# Patient Record
Sex: Female | Born: 1950 | Race: White | Hispanic: No | Marital: Married | Smoking: Never smoker
Health system: Southern US, Community
[De-identification: ages and names within clinical notes are randomized; demographics above are authoritative.]

---

## 2009-05-06 ENCOUNTER — Ambulatory Visit: Payer: Self-pay | Admitting: Oncology

## 2009-05-11 ENCOUNTER — Ambulatory Visit: Payer: Self-pay | Admitting: Hematology & Oncology

## 2009-05-19 LAB — CBC WITH DIFFERENTIAL (CANCER CENTER ONLY)
LYMPH#: 2 10*3/uL (ref 0.9–3.3)
LYMPH%: 36.4 % (ref 14.0–48.0)
MCHC: 34.1 g/dL (ref 32.0–36.0)
MCV: 85 fL (ref 81–101)
MONO#: 0.3 10*3/uL (ref 0.1–0.9)
MONO%: 5.2 % (ref 0.0–13.0)
Platelets: 239 10*3/uL (ref 145–400)
RBC: 4.71 10*6/uL (ref 3.70–5.32)
RDW: 12 % (ref 10.5–14.6)

## 2009-05-20 LAB — COMPREHENSIVE METABOLIC PANEL
AST: 23 U/L (ref 0–37)
Alkaline Phosphatase: 91 U/L (ref 39–117)
CO2: 30 mEq/L (ref 19–32)
Chloride: 103 mEq/L (ref 96–112)
Creatinine, Ser: 0.84 mg/dL (ref 0.40–1.20)
Total Bilirubin: 0.5 mg/dL (ref 0.3–1.2)
Total Protein: 6.7 g/dL (ref 6.0–8.3)

## 2009-05-20 LAB — VITAMIN D 25 HYDROXY (VIT D DEFICIENCY, FRACTURES): Vit D, 25-Hydroxy: 36 ng/mL (ref 30–89)

## 2009-05-27 ENCOUNTER — Ambulatory Visit (HOSPITAL_COMMUNITY): Admission: RE | Admit: 2009-05-27 | Discharge: 2009-05-27 | Payer: Self-pay | Admitting: Hematology & Oncology

## 2009-06-16 ENCOUNTER — Ambulatory Visit: Payer: Self-pay | Admitting: Hematology & Oncology

## 2009-06-17 ENCOUNTER — Encounter: Admission: RE | Admit: 2009-06-17 | Discharge: 2009-06-17 | Payer: Self-pay | Admitting: Family Medicine

## 2009-10-07 ENCOUNTER — Ambulatory Visit (HOSPITAL_COMMUNITY): Admission: RE | Admit: 2009-10-07 | Discharge: 2009-10-07 | Payer: Self-pay | Admitting: Hematology & Oncology

## 2009-10-12 ENCOUNTER — Ambulatory Visit: Payer: Self-pay | Admitting: Hematology & Oncology

## 2009-10-14 LAB — CBC WITH DIFFERENTIAL (CANCER CENTER ONLY)
BASO#: 0 10*3/uL (ref 0.0–0.2)
Eosinophils Absolute: 0.1 10*3/uL (ref 0.0–0.5)
HGB: 13.8 g/dL (ref 11.6–15.9)
LYMPH#: 1.8 10*3/uL (ref 0.9–3.3)
MCHC: 33.5 g/dL (ref 32.0–36.0)
MCV: 85 fL (ref 81–101)
MONO#: 0.3 10*3/uL (ref 0.1–0.9)
MONO%: 6.7 % (ref 0.0–13.0)
NEUT#: 1.5 10*3/uL (ref 1.5–6.5)
RDW: 12.1 % (ref 10.5–14.6)

## 2009-10-15 LAB — COMPREHENSIVE METABOLIC PANEL
ALT: 12 U/L (ref 0–35)
AST: 20 U/L (ref 0–37)
Albumin: 4.1 g/dL (ref 3.5–5.2)
Creatinine, Ser: 0.91 mg/dL (ref 0.40–1.20)
Potassium: 4 mEq/L (ref 3.5–5.3)
Total Bilirubin: 0.5 mg/dL (ref 0.3–1.2)

## 2009-10-15 LAB — LACTATE DEHYDROGENASE: LDH: 123 U/L (ref 94–250)

## 2009-10-15 LAB — VITAMIN D 25 HYDROXY (VIT D DEFICIENCY, FRACTURES): Vit D, 25-Hydroxy: 45 ng/mL (ref 30–89)

## 2010-02-09 ENCOUNTER — Ambulatory Visit: Payer: Self-pay | Admitting: Hematology & Oncology

## 2010-02-10 LAB — LACTATE DEHYDROGENASE: LDH: 125 U/L (ref 94–250)

## 2010-02-10 LAB — CBC WITH DIFFERENTIAL (CANCER CENTER ONLY)
BASO#: 0 10*3/uL (ref 0.0–0.2)
BASO%: 0.5 % (ref 0.0–2.0)
EOS%: 3.3 % (ref 0.0–7.0)
Eosinophils Absolute: 0.2 10*3/uL (ref 0.0–0.5)
HCT: 41.5 % (ref 34.8–46.6)
HGB: 14 g/dL (ref 11.6–15.9)
LYMPH#: 2 10*3/uL (ref 0.9–3.3)
LYMPH%: 45.8 % (ref 14.0–48.0)
MCH: 28.8 pg (ref 26.0–34.0)
MCHC: 33.6 g/dL (ref 32.0–36.0)
MCV: 86 fL (ref 81–101)
MONO#: 0.3 10*3/uL (ref 0.1–0.9)
MONO%: 6.8 % (ref 0.0–13.0)
NEUT#: 1.9 10*3/uL (ref 1.5–6.5)
NEUT%: 43.6 % (ref 39.6–80.0)
Platelets: 230 10*3/uL (ref 145–400)
RBC: 4.84 10*6/uL (ref 3.70–5.32)
RDW: 11.9 % (ref 10.5–14.6)
WBC: 4.4 10*3/uL (ref 3.9–10.0)

## 2010-02-10 LAB — COMPREHENSIVE METABOLIC PANEL
ALT: 13 U/L (ref 0–35)
Albumin: 4 g/dL (ref 3.5–5.2)
Glucose, Bld: 93 mg/dL (ref 70–99)
Potassium: 4.3 mEq/L (ref 3.5–5.3)
Sodium: 141 mEq/L (ref 135–145)
Total Protein: 6.6 g/dL (ref 6.0–8.3)

## 2010-02-10 LAB — VITAMIN D 25 HYDROXY (VIT D DEFICIENCY, FRACTURES): Vit D, 25-Hydroxy: 66 ng/mL (ref 30–89)

## 2010-02-17 ENCOUNTER — Ambulatory Visit (HOSPITAL_COMMUNITY): Admission: RE | Admit: 2010-02-17 | Discharge: 2010-02-17 | Payer: Self-pay | Admitting: Hematology & Oncology

## 2010-06-11 ENCOUNTER — Encounter: Payer: Self-pay | Admitting: Hematology & Oncology

## 2010-07-20 ENCOUNTER — Other Ambulatory Visit: Payer: Self-pay | Admitting: Internal Medicine

## 2010-07-20 DIAGNOSIS — Z1231 Encounter for screening mammogram for malignant neoplasm of breast: Secondary | ICD-10-CM

## 2010-07-26 ENCOUNTER — Ambulatory Visit
Admission: RE | Admit: 2010-07-26 | Discharge: 2010-07-26 | Disposition: A | Discharge status: Home / Self Care | Payer: 59 | Source: Ambulatory Visit | Attending: Internal Medicine | Admitting: Internal Medicine

## 2010-07-26 DIAGNOSIS — Z1231 Encounter for screening mammogram for malignant neoplasm of breast: Secondary | ICD-10-CM

## 2010-08-11 ENCOUNTER — Other Ambulatory Visit: Payer: Self-pay | Admitting: Hematology & Oncology

## 2010-08-11 ENCOUNTER — Encounter (HOSPITAL_BASED_OUTPATIENT_CLINIC_OR_DEPARTMENT_OTHER): Payer: 59 | Admitting: Hematology & Oncology

## 2010-08-11 DIAGNOSIS — C8581 Other specified types of non-Hodgkin lymphoma, lymph nodes of head, face, and neck: Secondary | ICD-10-CM

## 2010-08-11 LAB — CBC WITH DIFFERENTIAL (CANCER CENTER ONLY)
BASO#: 0 10*3/uL (ref 0.0–0.2)
BASO%: 0.2 % (ref 0.0–2.0)
EOS%: 3.3 % (ref 0.0–7.0)
HCT: 39.3 % (ref 34.8–46.6)
HGB: 13 g/dL (ref 11.6–15.9)
LYMPH%: 47.3 % (ref 14.0–48.0)
MCV: 83 fL (ref 81–101)
MONO#: 0.4 10*3/uL (ref 0.1–0.9)
NEUT%: 40.8 % (ref 39.6–80.0)
Platelets: 214 10*3/uL (ref 145–400)
RBC: 4.73 10*6/uL (ref 3.70–5.32)
WBC: 4.5 10*3/uL (ref 3.9–10.0)

## 2010-08-12 LAB — COMPREHENSIVE METABOLIC PANEL
Alkaline Phosphatase: 90 U/L (ref 39–117)
BUN: 22 mg/dL (ref 6–23)
CO2: 30 mEq/L (ref 19–32)
Chloride: 106 mEq/L (ref 96–112)
Creatinine, Ser: 0.9 mg/dL (ref 0.40–1.20)
Glucose, Bld: 83 mg/dL (ref 70–99)
Total Bilirubin: 0.4 mg/dL (ref 0.3–1.2)

## 2010-08-12 LAB — LACTATE DEHYDROGENASE: LDH: 139 U/L (ref 94–250)

## 2010-08-12 LAB — VITAMIN D 25 HYDROXY (VIT D DEFICIENCY, FRACTURES): Vit D, 25-Hydroxy: 77 ng/mL (ref 30–89)

## 2011-02-02 ENCOUNTER — Other Ambulatory Visit: Payer: Self-pay | Admitting: Hematology & Oncology

## 2011-02-02 LAB — CBC WITH DIFFERENTIAL (CANCER CENTER ONLY)
BASO#: 0 10*3/uL (ref 0.0–0.2)
Eosinophils Absolute: 0.1 10*3/uL (ref 0.0–0.5)
HGB: 13.5 g/dL (ref 11.6–15.9)
LYMPH#: 2.3 10*3/uL (ref 0.9–3.3)
LYMPH%: 39.7 % (ref 14.0–48.0)
MCV: 83 fL (ref 81–101)
NEUT#: 3.1 10*3/uL (ref 1.5–6.5)
RBC: 4.66 10*6/uL (ref 3.70–5.32)
WBC: 5.9 10*3/uL (ref 3.9–10.0)

## 2011-02-03 LAB — LACTATE DEHYDROGENASE: LDH: 125 U/L (ref 94–250)

## 2011-02-03 LAB — COMPREHENSIVE METABOLIC PANEL
AST: 19 U/L (ref 0–37)
BUN: 19 mg/dL (ref 6–23)
Calcium: 9.5 mg/dL (ref 8.4–10.5)
Chloride: 103 mEq/L (ref 96–112)
Creatinine, Ser: 0.96 mg/dL (ref 0.50–1.10)

## 2011-02-03 LAB — VITAMIN D 25 HYDROXY (VIT D DEFICIENCY, FRACTURES): Vit D, 25-Hydroxy: 71 ng/mL (ref 30–89)

## 2011-08-03 ENCOUNTER — Ambulatory Visit (HOSPITAL_BASED_OUTPATIENT_CLINIC_OR_DEPARTMENT_OTHER): Payer: 59 | Admitting: Hematology & Oncology

## 2011-08-03 ENCOUNTER — Other Ambulatory Visit (HOSPITAL_BASED_OUTPATIENT_CLINIC_OR_DEPARTMENT_OTHER): Payer: 59 | Admitting: Lab

## 2011-08-03 VITALS — BP 121/74 | HR 99 | Temp 97.0°F | Ht 65.5 in | Wt 169.0 lb

## 2011-08-03 DIAGNOSIS — C8595 Non-Hodgkin lymphoma, unspecified, lymph nodes of inguinal region and lower limb: Secondary | ICD-10-CM

## 2011-08-03 DIAGNOSIS — M810 Age-related osteoporosis without current pathological fracture: Secondary | ICD-10-CM

## 2011-08-03 DIAGNOSIS — C8590 Non-Hodgkin lymphoma, unspecified, unspecified site: Secondary | ICD-10-CM

## 2011-08-03 LAB — COMPREHENSIVE METABOLIC PANEL WITH GFR
ALT: 15 U/L (ref 0–35)
AST: 24 U/L (ref 0–37)
Albumin: 4 g/dL (ref 3.5–5.2)
Alkaline Phosphatase: 91 U/L (ref 39–117)
BUN: 20 mg/dL (ref 6–23)
CO2: 29 meq/L (ref 19–32)
Calcium: 9.5 mg/dL (ref 8.4–10.5)
Chloride: 104 meq/L (ref 96–112)
Creatinine, Ser: 0.98 mg/dL (ref 0.50–1.10)
Glucose, Bld: 83 mg/dL (ref 70–99)
Potassium: 4.4 meq/L (ref 3.5–5.3)
Sodium: 142 meq/L (ref 135–145)
Total Bilirubin: 0.5 mg/dL (ref 0.3–1.2)
Total Protein: 6.2 g/dL (ref 6.0–8.3)

## 2011-08-03 LAB — CBC WITH DIFFERENTIAL (CANCER CENTER ONLY)
Eosinophils Absolute: 0.2 10*3/uL (ref 0.0–0.5)
LYMPH#: 2.4 10*3/uL (ref 0.9–3.3)
MCV: 85 fL (ref 81–101)
MONO#: 0.5 10*3/uL (ref 0.1–0.9)
NEUT#: 1.9 10*3/uL (ref 1.5–6.5)
Platelets: 214 10*3/uL (ref 145–400)
RBC: 4.83 10*6/uL (ref 3.70–5.32)
WBC: 5 10*3/uL (ref 3.9–10.0)

## 2011-08-03 LAB — LACTATE DEHYDROGENASE: LDH: 132 U/L (ref 94–250)

## 2011-08-03 NOTE — Progress Notes (Signed)
This office note has been dictated.

## 2011-08-06 NOTE — Progress Notes (Signed)
CC:   Crist Fat. Rivard, M.D. Tammy R. Collins Scotland, M.D. 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  CURRENT THERAPY:  Observation.  INTERIM HISTORY:  Elaine Henderson comes in for a 6 month followup.  She is doing great.  She and her husband are getting ready for the daughter's marriage on Easter.  They are heading over to [place] for this.  She does see Dr. Dois Davenport Rivard now.  She underwent ultrasound of the pelvic area.  Everything looked fantastic.  The patient has not had any noted any problems with cough.  There has been no nausea, vomiting. There has been no weight loss or weight gain.  There has been no rashes. She has had no pruritus.  PHYSICAL EXAMINATION:  General:  This is a well-developed, well- nourished white female in no obvious distress.  Vital signs:  97, pulse 90, respiratory rate 18, blood pressure 121/74.  Weight is 169.  Head and neck:  Exam shows a normocephalic, atraumatic skull.  There are no ocular or oral lesions.  There are no palpable cervical or supraclavicular lymph nodes.  Lungs:  Are clear bilaterally.  Cardiac: Exam regular rate and rhythm with normal S1, S2.  There are no murmurs, rubs or bruits.  Abdomen:  Soft with good bowel sounds.  There is no palpable abdominal mass.  There is no palpable hepatosplenomegaly. Axillary exam shows no axillary adenopathy bilaterally.  Extremities: Shows no clubbing, cyanosis or edema.  Neurologic:  Exam shows no focal neurological deficits.  Skin:  No rash, ecchymosis or petechia.  LABORATORY STUDIES:  Show a white cell count of 5, hemoglobin 14, hematocrit 41, platelet count 214.  IMPRESSION:  Elaine Henderson is a 61 year old white female with history of diffuse large cell non-Hodgkin's lymphoma of the cervix.  She underwent chemotherapy with R-CHOP.  She completed chemotherapy back in  I think early 2010.  For now, I do not see  any evidence of recurrence.  I do not see need for any scans right now.  We will get her back in 6 months.    ______________________________ Josph Macho, M.D. PRE/MEDQ  D:  08/03/2011  T:  08/03/2011  Job:  [id number]

## 2011-08-07 ENCOUNTER — Other Ambulatory Visit: Payer: Self-pay | Admitting: Internal Medicine

## 2011-08-07 DIAGNOSIS — Z803 Family history of malignant neoplasm of breast: Secondary | ICD-10-CM

## 2011-08-07 DIAGNOSIS — Z1231 Encounter for screening mammogram for malignant neoplasm of breast: Secondary | ICD-10-CM

## 2011-08-10 ENCOUNTER — Telehealth: Payer: Self-pay | Admitting: *Deleted

## 2011-08-10 NOTE — Telephone Encounter (Signed)
Dr. Gustavo Lah message left on pt's home answering machine.

## 2011-08-10 NOTE — Telephone Encounter (Signed)
Message copied by Mirian Capuchin on Fri Aug 10, 2011  6:03 PM ------      Message from: Josph Macho      Created: Tue Aug 07, 2011  9:30 PM       Call - labs are great.  Elaine Henderson

## 2011-08-13 ENCOUNTER — Ambulatory Visit
Admission: RE | Admit: 2011-08-13 | Discharge: 2011-08-13 | Disposition: A | Discharge status: Home / Self Care | Payer: 59 | Source: Ambulatory Visit | Attending: Internal Medicine | Admitting: Internal Medicine

## 2011-08-13 DIAGNOSIS — Z803 Family history of malignant neoplasm of breast: Secondary | ICD-10-CM

## 2011-08-13 DIAGNOSIS — Z1231 Encounter for screening mammogram for malignant neoplasm of breast: Secondary | ICD-10-CM

## 2012-02-29 ENCOUNTER — Other Ambulatory Visit (HOSPITAL_BASED_OUTPATIENT_CLINIC_OR_DEPARTMENT_OTHER): Payer: 59 | Admitting: Lab

## 2012-02-29 ENCOUNTER — Ambulatory Visit (HOSPITAL_BASED_OUTPATIENT_CLINIC_OR_DEPARTMENT_OTHER): Payer: 59 | Admitting: Hematology & Oncology

## 2012-02-29 VITALS — BP 108/59 | HR 70 | Temp 97.7°F | Resp 16 | Ht 66.0 in | Wt 173.0 lb

## 2012-02-29 DIAGNOSIS — C8590 Non-Hodgkin lymphoma, unspecified, unspecified site: Secondary | ICD-10-CM

## 2012-02-29 DIAGNOSIS — C8595 Non-Hodgkin lymphoma, unspecified, lymph nodes of inguinal region and lower limb: Secondary | ICD-10-CM

## 2012-02-29 DIAGNOSIS — M810 Age-related osteoporosis without current pathological fracture: Secondary | ICD-10-CM

## 2012-02-29 LAB — CBC WITH DIFFERENTIAL (CANCER CENTER ONLY)
BASO#: 0 10*3/uL (ref 0.0–0.2)
Eosinophils Absolute: 0.1 10*3/uL (ref 0.0–0.5)
HCT: 42.1 % (ref 34.8–46.6)
HGB: 14.6 g/dL (ref 11.6–15.9)
LYMPH#: 2.1 10*3/uL (ref 0.9–3.3)
LYMPH%: 43.7 % (ref 14.0–48.0)
MCV: 83 fL (ref 81–101)
MONO#: 0.4 10*3/uL (ref 0.1–0.9)
NEUT%: 45.6 % (ref 39.6–80.0)
WBC: 4.9 10*3/uL (ref 3.9–10.0)

## 2012-02-29 NOTE — Progress Notes (Signed)
This office note has been dictated.

## 2012-03-01 LAB — COMPREHENSIVE METABOLIC PANEL
AST: 19 U/L (ref 0–37)
Albumin: 3.8 g/dL (ref 3.5–5.2)
BUN: 19 mg/dL (ref 6–23)
CO2: 31 mEq/L (ref 19–32)
Calcium: 9.6 mg/dL (ref 8.4–10.5)
Chloride: 103 mEq/L (ref 96–112)
Glucose, Bld: 109 mg/dL — ABNORMAL HIGH (ref 70–99)
Potassium: 4.3 mEq/L (ref 3.5–5.3)

## 2012-03-01 LAB — VITAMIN D 25 HYDROXY (VIT D DEFICIENCY, FRACTURES): Vit D, 25-Hydroxy: 69 ng/mL (ref 30–89)

## 2012-03-03 NOTE — Progress Notes (Signed)
CC:   Crist Fat. Rivard, M.D. Patrcia Dolly, MD, Fax [phone] Tammy R. Collins Scotland, M.D.  DIAGNOSIS:  Stage 1E large-cell lymphoma of the cervix, remission.  CURRENT THERAPY:  Observation.  INTERIM HISTORY:  Ms. Jeske comes in for followup.  We see her every 6 months.  Since we last saw her, she has been doing real well.  She did have a MRSA infection down in the labial area.  This was taken care of, I think, surgically and then antibiotics.  She feels well now.  She has had no problems with respect to recurrent lymphoma.  It has been 4 years since she completed her chemotherapy.  She has been up to [place], I think, for a couple of weeks, for a nephew's wedding.  She now works at a [employer].  She got a job in June.  The patient has had no problems with fevers.  There has been no bony pain.  There has been no cough or shortness of breath.  She has had no change with medications.  PHYSICAL EXAMINATION:  This is a well-developed, well-nourished white female in no obvious distress.  Vital signs:  Temperature of 97.7, pulse 70, respiratory rate 16, blood pressure is 108/59.  Weight is 173. Head/neck:  Normocephalic, atraumatic skull.  There are no ocular or oral lesions.  There are no palpable cervical or supraclavicular lymph nodes.  Lungs:  Clear to percussion and auscultation bilaterally. Cardiac:  Regular rate and rhythm with a normal S1 and S2.  There are no murmurs, rubs or bruits.  Abdomen:  Soft with good bowel sounds.  There is no palpable abdominal mass.  There is no palpable hepatosplenomegaly. Extremities:  No clubbing, cyanosis or edema.  Neurologic:  No focal neurological deficits.  LABORATORY STUDIES:  White cell count is 4.9, hemoglobin 14.6, hematocrit 42.1, platelet count 221.  IMPRESSION:  Ms. Andros is a 61 year old white female with large-cell lymphoma of the cervix.  She was treated with systemic chemotherapy.  She is doing quite well.  I do not  see any evidence of recurrent disease.  I do not see a need for any scans on her right now.  She is asymptomatic.  She is taking vitamin D, which I think is key for her.  We will plan to get her back in 6 months' time.  She will be out 5 years in, I think, November of 2014.  At that point in time, we can probably let her go from the clinic.    ______________________________ Josph Macho, M.D. PRE/MEDQ  D:  02/29/2012  T:  02/29/2012  Job:  [id number]

## 2012-03-10 ENCOUNTER — Telehealth: Payer: Self-pay | Admitting: Hematology & Oncology

## 2012-03-10 NOTE — Telephone Encounter (Signed)
Message copied by Cathi Roan on Mon Mar 10, 2012 10:17 AM ------      Message from: Currie, Virginia N      Created: Fri Mar 07, 2012 12:35 PM                   ----- Message -----         From: Josph Macho, MD         Sent: 03/06/2012   7:43 AM           To: Nanci Pina Nurse Hp            Call and tell her that her labs look fantastic. Thanks. Cindee Lame

## 2012-03-10 NOTE — Telephone Encounter (Addendum)
Message copied by Cathi Roan on Mon Mar 10, 2012  3:36 PM ------      Message from: Gray, Virginia N      Created: Fri Mar 07, 2012 12:35 PM                   ----- Message -----         From: Josph Macho, MD         Sent: 03/06/2012   7:43 AM           To: Nanci Pina Nurse Hp          Call and tell her that her labs look fantastic. Thanks. Cindee Lame  03-10-12  3:37pm   Called patient on home phone and left message regarding above MD note, advised pt if any questions to call office and ask for nurse. Lupita Raider LPN

## 2012-08-29 ENCOUNTER — Ambulatory Visit (HOSPITAL_BASED_OUTPATIENT_CLINIC_OR_DEPARTMENT_OTHER): Payer: 59 | Admitting: Hematology & Oncology

## 2012-08-29 ENCOUNTER — Other Ambulatory Visit (HOSPITAL_BASED_OUTPATIENT_CLINIC_OR_DEPARTMENT_OTHER): Payer: 59 | Admitting: Lab

## 2012-08-29 VITALS — BP 123/62 | HR 61 | Temp 97.4°F | Resp 16 | Ht 66.0 in | Wt 173.0 lb

## 2012-08-29 DIAGNOSIS — C8590 Non-Hodgkin lymphoma, unspecified, unspecified site: Secondary | ICD-10-CM

## 2012-08-29 DIAGNOSIS — C8595 Non-Hodgkin lymphoma, unspecified, lymph nodes of inguinal region and lower limb: Secondary | ICD-10-CM

## 2012-08-29 LAB — CBC WITH DIFFERENTIAL (CANCER CENTER ONLY)
Eosinophils Absolute: 0.1 10*3/uL (ref 0.0–0.5)
LYMPH#: 2.4 10*3/uL (ref 0.9–3.3)
MCH: 28.7 pg (ref 26.0–34.0)
MONO%: 8 % (ref 0.0–13.0)
NEUT#: 2.4 10*3/uL (ref 1.5–6.5)
Platelets: 213 10*3/uL (ref 145–400)
RBC: 4.88 10*6/uL (ref 3.70–5.32)
WBC: 5.4 10*3/uL (ref 3.9–10.0)

## 2012-08-29 LAB — COMPREHENSIVE METABOLIC PANEL
AST: 20 U/L (ref 0–37)
Albumin: 3.9 g/dL (ref 3.5–5.2)
Alkaline Phosphatase: 84 U/L (ref 39–117)
Glucose, Bld: 91 mg/dL (ref 70–99)
Potassium: 4.4 mEq/L (ref 3.5–5.3)
Sodium: 139 mEq/L (ref 135–145)
Total Protein: 6.4 g/dL (ref 6.0–8.3)

## 2012-08-29 NOTE — Progress Notes (Signed)
This office note has been dictated.

## 2012-09-01 NOTE — Progress Notes (Signed)
CC:   Crist Fat. Rivard, M.D. Tammy R. Collins Scotland, M.D. Burnett Kanaris, MD, Fax [phone]  DIAGNOSIS:  Stage IE large-cell lymphoma of the cervix, clinical remission.  CURRENT THERAPY:  Observation.  INTERIM HISTORY:  Ms. Elaine Henderson comes in for followup.  She is doing quite well.  We see her every 6 months.  Since we last saw her, she has had no problems.  She has had no fever, sweats or chills.  She has had no bony pain. There has been no bleeding.  She has had no cough or shortness of breath.  The patient says she does have occasional bouts of diarrhea.  PHYSICAL EXAMINATION:  General:  This is a well-developed, well- nourished white female in no obvious distress.  Vital signs: Temperature of 97.4, pulse 61, respiratory rate 16, blood pressure 123/62.  Weight is 173.  Head and neck:  Normocephalic, atraumatic skull.  There are no ocular or oral lesions.  There are no palpable cervical or supraclavicular lymph nodes.  Lungs:  Clear bilaterally. Cardiac:  Regular rate and rhythm, with normal S1, S2.  There are no murmurs, rubs or bruits.  Abdomen:  Soft with good bowel sounds.  There is no palpable abdominal mass.  There is no fluid wave.  There is no palpable hepatosplenomegaly.  Extremities:  Show no clubbing, cyanosis or edema.  Skin:  No rashes, ecchymosis, or petechia.  LABORATORY STUDIES:  White cell count is 5.4, hemoglobin 14, hematocrit 41.2, platelet count 213,000.  IMPRESSION:  Ms. Elaine Henderson is a very nice 62 year old white female with a history of a large-cell lymphoma of the cervix.  She is doing very well.  She is about 5 years out.  We will go ahead and get her back in 6 more months.  I do not see a need for any scans or additional lab work.    ______________________________ Josph Macho, M.D. PRE/MEDQ  D:  08/29/2012  T:  08/30/2012  Job:  [id number]

## 2012-10-20 ENCOUNTER — Other Ambulatory Visit: Payer: Self-pay

## 2012-10-20 DIAGNOSIS — Z1231 Encounter for screening mammogram for malignant neoplasm of breast: Secondary | ICD-10-CM

## 2012-12-01 ENCOUNTER — Ambulatory Visit: Payer: 59

## 2012-12-01 ENCOUNTER — Ambulatory Visit
Admission: RE | Admit: 2012-12-01 | Discharge: 2012-12-01 | Disposition: A | Discharge status: Home / Self Care | Payer: 59 | Source: Ambulatory Visit

## 2012-12-01 DIAGNOSIS — Z1231 Encounter for screening mammogram for malignant neoplasm of breast: Secondary | ICD-10-CM

## 2012-12-03 ENCOUNTER — Other Ambulatory Visit: Payer: Self-pay | Admitting: Internal Medicine

## 2012-12-03 DIAGNOSIS — R928 Other abnormal and inconclusive findings on diagnostic imaging of breast: Secondary | ICD-10-CM

## 2012-12-08 ENCOUNTER — Other Ambulatory Visit: Payer: Self-pay | Admitting: Registered Nurse

## 2012-12-08 ENCOUNTER — Other Ambulatory Visit (HOSPITAL_COMMUNITY)
Admission: RE | Admit: 2012-12-08 | Discharge: 2012-12-08 | Disposition: A | Discharge status: Home / Self Care | Payer: 59 | Source: Ambulatory Visit | Attending: Internal Medicine | Admitting: Internal Medicine

## 2012-12-08 DIAGNOSIS — Z01419 Encounter for gynecological examination (general) (routine) without abnormal findings: Secondary | ICD-10-CM | POA: Insufficient documentation

## 2012-12-19 ENCOUNTER — Ambulatory Visit
Admission: RE | Admit: 2012-12-19 | Discharge: 2012-12-19 | Disposition: A | Discharge status: Home / Self Care | Payer: 59 | Source: Ambulatory Visit | Attending: Internal Medicine | Admitting: Internal Medicine

## 2012-12-19 DIAGNOSIS — R928 Other abnormal and inconclusive findings on diagnostic imaging of breast: Secondary | ICD-10-CM

## 2013-02-05 ENCOUNTER — Telehealth: Payer: Self-pay | Admitting: Hematology & Oncology

## 2013-02-05 NOTE — Telephone Encounter (Signed)
Pt called to cancel and reschedule

## 2013-02-13 ENCOUNTER — Other Ambulatory Visit: Payer: 59 | Admitting: Lab

## 2013-02-13 ENCOUNTER — Ambulatory Visit: Payer: 59 | Admitting: Hematology & Oncology

## 2013-02-13 ENCOUNTER — Other Ambulatory Visit: Payer: Self-pay | Admitting: Gastroenterology

## 2013-02-16 ENCOUNTER — Other Ambulatory Visit (HOSPITAL_BASED_OUTPATIENT_CLINIC_OR_DEPARTMENT_OTHER): Payer: 59 | Admitting: Lab

## 2013-02-16 ENCOUNTER — Ambulatory Visit (HOSPITAL_BASED_OUTPATIENT_CLINIC_OR_DEPARTMENT_OTHER): Payer: 59 | Admitting: Hematology & Oncology

## 2013-02-16 VITALS — BP 113/63 | HR 67 | Temp 98.1°F | Resp 14 | Ht 66.0 in | Wt 171.0 lb

## 2013-02-16 DIAGNOSIS — C8589 Other specified types of non-Hodgkin lymphoma, extranodal and solid organ sites: Secondary | ICD-10-CM

## 2013-02-16 DIAGNOSIS — C8590 Non-Hodgkin lymphoma, unspecified, unspecified site: Secondary | ICD-10-CM

## 2013-02-16 LAB — CBC WITH DIFFERENTIAL (CANCER CENTER ONLY)
BASO%: 0.4 % (ref 0.0–2.0)
EOS%: 3.2 % (ref 0.0–7.0)
LYMPH%: 49.5 % — ABNORMAL HIGH (ref 14.0–48.0)
MCH: 28.4 pg (ref 26.0–34.0)
MCV: 85 fL (ref 81–101)
MONO%: 9.4 % (ref 0.0–13.0)
Platelets: 236 10*3/uL (ref 145–400)
RDW: 13.2 % (ref 11.1–15.7)

## 2013-02-16 LAB — COMPREHENSIVE METABOLIC PANEL
ALT: 13 U/L (ref 0–35)
Alkaline Phosphatase: 92 U/L (ref 39–117)
Sodium: 141 mEq/L (ref 135–145)
Total Bilirubin: 0.6 mg/dL (ref 0.3–1.2)
Total Protein: 6.5 g/dL (ref 6.0–8.3)

## 2013-02-16 NOTE — Progress Notes (Signed)
This office note has been dictated.

## 2013-02-17 NOTE — Progress Notes (Signed)
CC:   Elaine Henderson. Elaine Henderson, M.D.  DIAGNOSIS:  Stage IE large-cell lymphoma of the cervix -- clinical remission.  CURRENT THERAPY:  Observation.  INTERIM HISTORY:  Elaine Henderson comes in for followup.  We last saw her 6 months ago.  She has been doing well.  They just had their first grandchild.  It was a boy.  He was born a few weeks ago.  They are quite happy about this.  Overall, she has been doing well.  There has been no problems with nausea or vomiting.  There has been no fever.  There has been no change in bowel or bladder habits.  She has had a colonoscopy recently.  Everything turned out okay with the colonoscopy.  There has been no leg swelling.  She has had no rashes.  There has been no change in her medications.  Overall, her performance status is ECOG 0.  PHYSICAL EXAMINATION:  General:  This is a well-developed, well- nourished white female in no obvious distress.  Vital signs: Temperature of 98.1, pulse 67, respiratory rate 14, blood pressure 113/63.  Weight is 171 pounds.  Head and neck:  Exam shows a normocephalic, atraumatic skull.  She has no ocular or oral lesions. There are no cervical or supraclavicular lymph nodes.  Lungs:  Clear bilaterally.  Cardiac:  Regular rate and rhythm with a normal S1 and S2. There are no murmurs, rubs or bruits.  Abdomen:  Soft.  She has good bowel sounds.  There is no fluid wave.  There is no palpable hepatosplenomegaly.  Back:  No tenderness over the spine, ribs or hips. Extremities:  Shows no clubbing, cyanosis or edema.  Skin:  No rashes, ecchymoses or petechia.  Neurological:  Shows no focal neurological deficits.  LABORATORIES:  White cell count is 4.7, hemoglobin 13.5, hematocrit 40.3, platelet count 236.  Her LDH is 142.  Liver panel and electrolytes are all normal.  IMPRESSION:  Elaine Henderson is a very nice 62 year old white female with stage IE large cell non-Hodgkin lymphoma.  She is doing great.  She is now been in  remission for 5 years.  I still feel we have to follow her every 6 months for right now.  I noted that she does have a little more in the way of lymphocytes versus __________ neutrophils.  I did look at her blood smear.  I do not see anything unusual with the lymphocytes.  We will have to watch this.  We will see her back in 6 months.  I do not see that we have to do any flow cytometry studies on the blood at this point in time.  However, if we do find that her lymphocytes do continue to increase in percentage, we may have to consider this.  I spent a good half hour with Elaine Henderson  and her husband.  As always, it was fun to talk to them.    ______________________________ Elaine Henderson, M.D. PRE/MEDQ  D:  02/16/2013  T:  02/17/2013  Job:  [id number]

## 2013-08-21 ENCOUNTER — Encounter: Payer: Self-pay | Admitting: Hematology & Oncology

## 2013-08-21 ENCOUNTER — Ambulatory Visit (HOSPITAL_BASED_OUTPATIENT_CLINIC_OR_DEPARTMENT_OTHER): Payer: 59 | Admitting: Hematology & Oncology

## 2013-08-21 ENCOUNTER — Other Ambulatory Visit (HOSPITAL_BASED_OUTPATIENT_CLINIC_OR_DEPARTMENT_OTHER): Payer: 59 | Admitting: Lab

## 2013-08-21 VITALS — BP 125/58 | HR 63 | Temp 98.2°F | Resp 14 | Ht 66.0 in | Wt 180.0 lb

## 2013-08-21 DIAGNOSIS — C8589 Other specified types of non-Hodgkin lymphoma, extranodal and solid organ sites: Secondary | ICD-10-CM

## 2013-08-21 DIAGNOSIS — E559 Vitamin D deficiency, unspecified: Secondary | ICD-10-CM

## 2013-08-21 DIAGNOSIS — C8590 Non-Hodgkin lymphoma, unspecified, unspecified site: Secondary | ICD-10-CM

## 2013-08-21 DIAGNOSIS — R42 Dizziness and giddiness: Secondary | ICD-10-CM

## 2013-08-21 LAB — CBC WITH DIFFERENTIAL (CANCER CENTER ONLY)
BASO#: 0 10*3/uL (ref 0.0–0.2)
BASO%: 0.6 % (ref 0.0–2.0)
EOS ABS: 0.2 10*3/uL (ref 0.0–0.5)
EOS%: 3 % (ref 0.0–7.0)
HCT: 42 % (ref 34.8–46.6)
HEMOGLOBIN: 14.2 g/dL (ref 11.6–15.9)
LYMPH#: 2.5 10*3/uL (ref 0.9–3.3)
LYMPH%: 49.6 % — ABNORMAL HIGH (ref 14.0–48.0)
MCH: 28.2 pg (ref 26.0–34.0)
MCHC: 33.8 g/dL (ref 32.0–36.0)
MCV: 84 fL (ref 81–101)
MONO#: 0.5 10*3/uL (ref 0.1–0.9)
MONO%: 9.1 % (ref 0.0–13.0)
NEUT%: 37.7 % — ABNORMAL LOW (ref 39.6–80.0)
NEUTROS ABS: 1.9 10*3/uL (ref 1.5–6.5)
PLATELETS: 239 10*3/uL (ref 145–400)
RBC: 5.03 10*6/uL (ref 3.70–5.32)
RDW: 13.4 % (ref 11.1–15.7)
WBC: 5.1 10*3/uL (ref 3.9–10.0)

## 2013-08-21 LAB — CHCC SATELLITE - SMEAR

## 2013-08-21 NOTE — Progress Notes (Signed)
Hematology and Oncology Follow Up Visit  Elaine Henderson [id number] 01-29-1951 63 y.o. 08/21/2013   Principle Diagnosis:   Stage IE large cell lymphoma of the cervix-clinical remission  Current Therapy:    Observation     Interim History:  Ms.  Elaine Henderson is back for followup. We last saw her back in September. She's been doing fairly well. She is a grandmother. Her son had a son. She and her husband are really excited about this. Everything is doing well. She has had some dizzy spells. Mr. as to why she would have these. They do not seem to be all that frequent. She takes Antivert when they happen. There is no obvious vertigo. She has no tinnitus.  She's had a problem with bowels or bladder. She's had no fever sweats or chills.  Her last mammogram was done back in August of 2014. Medications: Current outpatient prescriptions:Calcium Carbonate-Vitamin D (CALTRATE 600+D) 600-400 MG-UNIT per tablet, Take 1 tablet by mouth daily., Disp: , Rfl: ;  Cholecalciferol (VITAMIN D-3) 5000 UNITS TABS, Take by mouth every morning., Disp: , Rfl: ;  Multiple Vitamin (MULTIVITAMIN) capsule, Take 1 capsule by mouth daily., Disp: , Rfl: ;  Probiotic Product (PROBIOTIC DAILY PO), Take by mouth every morning., Disp: , Rfl:  Ranitidine HCl (ZANTAC 75 PO), Take by mouth 2 (two) times daily., Disp: , Rfl:   Allergies:  Allergies  Allergen Reactions  . Nitrofurantoin Monohyd Macro Nausea Only    Past Medical History, Surgical history, Social history, and Family History were reviewed and updated.  Review of Systems: As above  Physical Exam:  height is 5\' 6"  (1.676 m) and weight is 180 lb (81.647 kg). Her oral temperature is 98.2 F (36.8 C). Her blood pressure is 125/58 and her pulse is 63. Her respiration is 14.   Lungs are clear. I hear no carotid bruits. Cardiac exam regular rhythm. Abdomen soft. No fluid wave. Good bowel sounds. No palpable hepatosplenomegaly. Back exam no tenderness over the spine  ribs or hips. Extremities good strength bilaterally. Skin exam no rashes. Neurological exam no focal neurological deficits.  Lab Results  Component Value Date   WBC 5.1 08/21/2013   HGB 14.2 08/21/2013   HCT 42.0 08/21/2013   MCV 84 08/21/2013   PLT 239 08/21/2013     Chemistry      Component Value Date/Time   NA 141 02/16/2013 0947   K 4.3 02/16/2013 0947   CL 104 02/16/2013 0947   CO2 31 02/16/2013 0947   BUN 18 02/16/2013 0947   CREATININE 0.91 02/16/2013 0947      Component Value Date/Time   CALCIUM 9.5 02/16/2013 0947   ALKPHOS 92 02/16/2013 0947   AST 23 02/16/2013 0947   ALT 13 02/16/2013 0947   BILITOT 0.6 02/16/2013 0947         Impression and Plan: Ms. [name] is a 63 year old white female. She has a history of large cell lymphoma of the cervix. She now is free from disease for over 5 years.  Again, I am not sure. as to why she has had the dizziness.  We will go ahead and plan for another followup in 6 months.  I think if all looks good when we see her back, and that hopefully we can let her go from the practice.   Volanda Napoleon, MD 4/3/201510:03 AM

## 2013-08-24 ENCOUNTER — Telehealth: Payer: Self-pay | Admitting: *Deleted

## 2013-08-24 NOTE — Telephone Encounter (Addendum)
Message copied by Orlando Penner on Mon Aug 24, 2013 11:19 AM ------      Message from: Volanda Napoleon      Created: Fri Aug 21, 2013  3:58 PM       Call - labs look great!! Laurey Arrow ------This message left on pt's home answering machine

## 2013-08-25 LAB — COMPREHENSIVE METABOLIC PANEL
ALBUMIN: 3.8 g/dL (ref 3.5–5.2)
ALK PHOS: 91 U/L (ref 39–117)
ALT: 15 U/L (ref 0–35)
AST: 20 U/L (ref 0–37)
BILIRUBIN TOTAL: 0.5 mg/dL (ref 0.2–1.2)
BUN: 25 mg/dL — ABNORMAL HIGH (ref 6–23)
CO2: 25 meq/L (ref 19–32)
Calcium: 9.7 mg/dL (ref 8.4–10.5)
Chloride: 106 mEq/L (ref 96–112)
Creatinine, Ser: 1.02 mg/dL (ref 0.50–1.10)
Glucose, Bld: 93 mg/dL (ref 70–99)
POTASSIUM: 4.4 meq/L (ref 3.5–5.3)
SODIUM: 142 meq/L (ref 135–145)
TOTAL PROTEIN: 6.5 g/dL (ref 6.0–8.3)

## 2013-08-25 LAB — PROTEIN ELECTROPHORESIS, SERUM
ALPHA-2-GLOBULIN: 11.7 % (ref 7.1–11.8)
Albumin ELP: 58.3 % (ref 55.8–66.1)
Alpha-1-Globulin: 2.9 % (ref 2.9–4.9)
BETA 2: 4.9 % (ref 3.2–6.5)
BETA GLOBULIN: 6.2 % (ref 4.7–7.2)
GAMMA GLOBULIN: 16 % (ref 11.1–18.8)
Total Protein, Serum Electrophoresis: 6.5 g/dL (ref 6.0–8.3)

## 2013-08-25 LAB — LACTATE DEHYDROGENASE: LDH: 152 U/L (ref 94–250)

## 2014-02-08 ENCOUNTER — Other Ambulatory Visit: Payer: Self-pay

## 2014-02-08 DIAGNOSIS — Z1231 Encounter for screening mammogram for malignant neoplasm of breast: Secondary | ICD-10-CM

## 2014-02-22 ENCOUNTER — Ambulatory Visit
Admission: RE | Admit: 2014-02-22 | Discharge: 2014-02-22 | Disposition: A | Discharge status: Home / Self Care | Payer: 59 | Source: Ambulatory Visit

## 2014-02-22 DIAGNOSIS — Z1231 Encounter for screening mammogram for malignant neoplasm of breast: Secondary | ICD-10-CM

## 2014-03-05 ENCOUNTER — Ambulatory Visit: Payer: 59 | Admitting: Hematology & Oncology

## 2014-03-05 ENCOUNTER — Other Ambulatory Visit: Payer: 59 | Admitting: Lab

## 2014-04-23 ENCOUNTER — Telehealth: Payer: Self-pay | Admitting: Hematology & Oncology

## 2014-04-23 ENCOUNTER — Other Ambulatory Visit: Payer: 59 | Admitting: Lab

## 2014-04-23 ENCOUNTER — Ambulatory Visit: Payer: 59 | Admitting: Family

## 2014-04-23 NOTE — Telephone Encounter (Signed)
Patient was sch for apt today.  Patient cx apt and left due to wait time, even though she was informed the computers were down.  She stated she would call back to resch

## 2015-02-14 ENCOUNTER — Other Ambulatory Visit: Payer: Self-pay

## 2015-02-14 DIAGNOSIS — Z1231 Encounter for screening mammogram for malignant neoplasm of breast: Secondary | ICD-10-CM

## 2015-02-28 ENCOUNTER — Ambulatory Visit
Admission: RE | Admit: 2015-02-28 | Discharge: 2015-02-28 | Disposition: A | Discharge status: Home / Self Care | Payer: BLUE CROSS/BLUE SHIELD | Source: Ambulatory Visit

## 2015-02-28 DIAGNOSIS — Z1231 Encounter for screening mammogram for malignant neoplasm of breast: Secondary | ICD-10-CM

## 2015-03-01 ENCOUNTER — Other Ambulatory Visit: Payer: Self-pay | Admitting: Internal Medicine

## 2015-03-01 DIAGNOSIS — R928 Other abnormal and inconclusive findings on diagnostic imaging of breast: Secondary | ICD-10-CM

## 2015-03-09 ENCOUNTER — Ambulatory Visit
Admission: RE | Admit: 2015-03-09 | Discharge: 2015-03-09 | Disposition: A | Discharge status: Home / Self Care | Payer: BLUE CROSS/BLUE SHIELD | Source: Ambulatory Visit | Attending: Internal Medicine | Admitting: Internal Medicine

## 2015-03-09 ENCOUNTER — Other Ambulatory Visit: Payer: BLUE CROSS/BLUE SHIELD

## 2015-03-09 DIAGNOSIS — R928 Other abnormal and inconclusive findings on diagnostic imaging of breast: Secondary | ICD-10-CM

## 2015-03-11 ENCOUNTER — Other Ambulatory Visit: Payer: BLUE CROSS/BLUE SHIELD

## 2016-06-22 ENCOUNTER — Other Ambulatory Visit: Payer: Self-pay | Admitting: Internal Medicine

## 2016-06-22 DIAGNOSIS — Z1231 Encounter for screening mammogram for malignant neoplasm of breast: Secondary | ICD-10-CM

## 2016-07-02 ENCOUNTER — Ambulatory Visit
Admission: RE | Admit: 2016-07-02 | Discharge: 2016-07-02 | Disposition: A | Discharge status: Home / Self Care | Payer: Medicare Other | Source: Ambulatory Visit | Attending: Internal Medicine | Admitting: Internal Medicine

## 2016-07-02 DIAGNOSIS — Z1231 Encounter for screening mammogram for malignant neoplasm of breast: Secondary | ICD-10-CM | POA: Diagnosis not present

## 2016-08-22 DIAGNOSIS — S2231XA Fracture of one rib, right side, initial encounter for closed fracture: Secondary | ICD-10-CM | POA: Diagnosis not present

## 2016-08-22 DIAGNOSIS — R0781 Pleurodynia: Secondary | ICD-10-CM | POA: Diagnosis not present

## 2016-08-22 DIAGNOSIS — S20211A Contusion of right front wall of thorax, initial encounter: Secondary | ICD-10-CM | POA: Diagnosis not present

## 2017-01-14 DIAGNOSIS — L03032 Cellulitis of left toe: Secondary | ICD-10-CM | POA: Diagnosis not present

## 2017-01-14 DIAGNOSIS — M25775 Osteophyte, left foot: Secondary | ICD-10-CM | POA: Diagnosis not present

## 2017-01-14 DIAGNOSIS — M79675 Pain in left toe(s): Secondary | ICD-10-CM | POA: Diagnosis not present

## 2017-02-11 DIAGNOSIS — Z0000 Encounter for general adult medical examination without abnormal findings: Secondary | ICD-10-CM | POA: Diagnosis not present

## 2017-02-11 DIAGNOSIS — M8580 Other specified disorders of bone density and structure, unspecified site: Secondary | ICD-10-CM | POA: Diagnosis not present

## 2017-02-11 DIAGNOSIS — E7800 Pure hypercholesterolemia, unspecified: Secondary | ICD-10-CM | POA: Diagnosis not present

## 2017-02-11 DIAGNOSIS — L600 Ingrowing nail: Secondary | ICD-10-CM | POA: Diagnosis not present

## 2017-02-18 DIAGNOSIS — Z1212 Encounter for screening for malignant neoplasm of rectum: Secondary | ICD-10-CM | POA: Diagnosis not present

## 2017-02-18 DIAGNOSIS — R42 Dizziness and giddiness: Secondary | ICD-10-CM | POA: Diagnosis not present

## 2017-02-18 DIAGNOSIS — N390 Urinary tract infection, site not specified: Secondary | ICD-10-CM | POA: Diagnosis not present

## 2017-02-18 DIAGNOSIS — L918 Other hypertrophic disorders of the skin: Secondary | ICD-10-CM | POA: Diagnosis not present

## 2017-02-18 DIAGNOSIS — L570 Actinic keratosis: Secondary | ICD-10-CM | POA: Diagnosis not present

## 2017-02-18 DIAGNOSIS — Z2821 Immunization not carried out because of patient refusal: Secondary | ICD-10-CM | POA: Diagnosis not present

## 2017-02-18 DIAGNOSIS — T50A95S Adverse effect of other bacterial vaccines, sequela: Secondary | ICD-10-CM | POA: Diagnosis not present

## 2017-02-18 DIAGNOSIS — E7800 Pure hypercholesterolemia, unspecified: Secondary | ICD-10-CM | POA: Diagnosis not present

## 2017-02-18 DIAGNOSIS — G2581 Restless legs syndrome: Secondary | ICD-10-CM | POA: Diagnosis not present

## 2017-02-18 DIAGNOSIS — R251 Tremor, unspecified: Secondary | ICD-10-CM | POA: Diagnosis not present

## 2017-02-18 DIAGNOSIS — Z23 Encounter for immunization: Secondary | ICD-10-CM | POA: Diagnosis not present

## 2017-02-18 DIAGNOSIS — Z6829 Body mass index (BMI) 29.0-29.9, adult: Secondary | ICD-10-CM | POA: Diagnosis not present

## 2017-02-18 DIAGNOSIS — M8580 Other specified disorders of bone density and structure, unspecified site: Secondary | ICD-10-CM | POA: Diagnosis not present

## 2017-02-25 DIAGNOSIS — M79675 Pain in left toe(s): Secondary | ICD-10-CM | POA: Diagnosis not present

## 2017-02-25 DIAGNOSIS — M8580 Other specified disorders of bone density and structure, unspecified site: Secondary | ICD-10-CM | POA: Diagnosis not present

## 2017-02-25 DIAGNOSIS — M79674 Pain in right toe(s): Secondary | ICD-10-CM | POA: Diagnosis not present

## 2017-02-25 DIAGNOSIS — M859 Disorder of bone density and structure, unspecified: Secondary | ICD-10-CM | POA: Diagnosis not present

## 2017-05-27 DIAGNOSIS — J019 Acute sinusitis, unspecified: Secondary | ICD-10-CM | POA: Diagnosis not present

## 2017-06-13 DIAGNOSIS — J029 Acute pharyngitis, unspecified: Secondary | ICD-10-CM | POA: Diagnosis not present

## 2017-07-01 DIAGNOSIS — B309 Viral conjunctivitis, unspecified: Secondary | ICD-10-CM | POA: Diagnosis not present

## 2017-07-01 DIAGNOSIS — J019 Acute sinusitis, unspecified: Secondary | ICD-10-CM | POA: Diagnosis not present

## 2017-09-26 DIAGNOSIS — R0781 Pleurodynia: Secondary | ICD-10-CM | POA: Diagnosis not present

## 2017-09-26 DIAGNOSIS — S299XXA Unspecified injury of thorax, initial encounter: Secondary | ICD-10-CM | POA: Diagnosis not present

## 2017-11-13 DIAGNOSIS — M5136 Other intervertebral disc degeneration, lumbar region: Secondary | ICD-10-CM | POA: Diagnosis not present

## 2017-11-13 DIAGNOSIS — R300 Dysuria: Secondary | ICD-10-CM | POA: Diagnosis not present

## 2017-11-13 DIAGNOSIS — M549 Dorsalgia, unspecified: Secondary | ICD-10-CM | POA: Diagnosis not present

## 2018-01-06 ENCOUNTER — Other Ambulatory Visit: Payer: Self-pay | Admitting: Internal Medicine

## 2018-01-06 DIAGNOSIS — Z1231 Encounter for screening mammogram for malignant neoplasm of breast: Secondary | ICD-10-CM

## 2018-01-23 DIAGNOSIS — R350 Frequency of micturition: Secondary | ICD-10-CM | POA: Diagnosis not present

## 2018-01-23 DIAGNOSIS — E7800 Pure hypercholesterolemia, unspecified: Secondary | ICD-10-CM | POA: Diagnosis not present

## 2018-01-23 DIAGNOSIS — N390 Urinary tract infection, site not specified: Secondary | ICD-10-CM | POA: Diagnosis not present

## 2018-02-12 ENCOUNTER — Ambulatory Visit
Admission: RE | Admit: 2018-02-12 | Discharge: 2018-02-12 | Disposition: A | Discharge status: Home / Self Care | Payer: Medicare Other | Source: Ambulatory Visit | Attending: Internal Medicine | Admitting: Internal Medicine

## 2018-02-12 ENCOUNTER — Ambulatory Visit: Payer: Medicare Other

## 2018-02-12 DIAGNOSIS — Z1231 Encounter for screening mammogram for malignant neoplasm of breast: Secondary | ICD-10-CM

## 2018-03-21 DIAGNOSIS — R300 Dysuria: Secondary | ICD-10-CM | POA: Diagnosis not present

## 2018-03-21 DIAGNOSIS — N390 Urinary tract infection, site not specified: Secondary | ICD-10-CM | POA: Diagnosis not present

## 2018-03-21 DIAGNOSIS — R3915 Urgency of urination: Secondary | ICD-10-CM | POA: Diagnosis not present

## 2018-04-16 DIAGNOSIS — B355 Tinea imbricata: Secondary | ICD-10-CM | POA: Diagnosis not present

## 2018-05-05 DIAGNOSIS — R071 Chest pain on breathing: Secondary | ICD-10-CM | POA: Diagnosis not present

## 2018-05-05 DIAGNOSIS — R0781 Pleurodynia: Secondary | ICD-10-CM | POA: Diagnosis not present

## 2018-05-05 DIAGNOSIS — S299XXA Unspecified injury of thorax, initial encounter: Secondary | ICD-10-CM | POA: Diagnosis not present

## 2018-05-05 DIAGNOSIS — R8281 Pyuria: Secondary | ICD-10-CM | POA: Diagnosis not present

## 2018-05-23 DIAGNOSIS — N3000 Acute cystitis without hematuria: Secondary | ICD-10-CM | POA: Diagnosis not present

## 2018-05-23 DIAGNOSIS — N390 Urinary tract infection, site not specified: Secondary | ICD-10-CM | POA: Diagnosis not present

## 2018-05-23 DIAGNOSIS — R300 Dysuria: Secondary | ICD-10-CM | POA: Diagnosis not present

## 2018-05-23 DIAGNOSIS — R05 Cough: Secondary | ICD-10-CM | POA: Diagnosis not present

## 2018-06-18 DIAGNOSIS — E7800 Pure hypercholesterolemia, unspecified: Secondary | ICD-10-CM | POA: Diagnosis not present

## 2018-06-18 DIAGNOSIS — Z1159 Encounter for screening for other viral diseases: Secondary | ICD-10-CM | POA: Diagnosis not present

## 2018-06-25 DIAGNOSIS — Z9221 Personal history of antineoplastic chemotherapy: Secondary | ICD-10-CM | POA: Diagnosis not present

## 2018-06-25 DIAGNOSIS — Z23 Encounter for immunization: Secondary | ICD-10-CM | POA: Diagnosis not present

## 2018-06-25 DIAGNOSIS — Z1211 Encounter for screening for malignant neoplasm of colon: Secondary | ICD-10-CM | POA: Diagnosis not present

## 2018-06-25 DIAGNOSIS — R0781 Pleurodynia: Secondary | ICD-10-CM | POA: Diagnosis not present

## 2018-06-25 DIAGNOSIS — Z8572 Personal history of non-Hodgkin lymphomas: Secondary | ICD-10-CM | POA: Diagnosis not present

## 2018-06-25 DIAGNOSIS — Z0000 Encounter for general adult medical examination without abnormal findings: Secondary | ICD-10-CM | POA: Diagnosis not present

## 2018-06-25 DIAGNOSIS — R251 Tremor, unspecified: Secondary | ICD-10-CM | POA: Diagnosis not present

## 2018-06-25 DIAGNOSIS — R42 Dizziness and giddiness: Secondary | ICD-10-CM | POA: Diagnosis not present

## 2018-06-25 DIAGNOSIS — E7800 Pure hypercholesterolemia, unspecified: Secondary | ICD-10-CM | POA: Diagnosis not present

## 2018-06-25 DIAGNOSIS — M8580 Other specified disorders of bone density and structure, unspecified site: Secondary | ICD-10-CM | POA: Diagnosis not present

## 2018-06-25 DIAGNOSIS — N390 Urinary tract infection, site not specified: Secondary | ICD-10-CM | POA: Diagnosis not present

## 2018-07-02 DIAGNOSIS — H8113 Benign paroxysmal vertigo, bilateral: Secondary | ICD-10-CM | POA: Diagnosis not present

## 2018-07-08 DIAGNOSIS — M940 Chondrocostal junction syndrome [Tietze]: Secondary | ICD-10-CM | POA: Diagnosis not present

## 2018-07-08 DIAGNOSIS — R0781 Pleurodynia: Secondary | ICD-10-CM | POA: Diagnosis not present

## 2018-07-10 ENCOUNTER — Other Ambulatory Visit: Payer: Self-pay | Admitting: Orthopedic Surgery

## 2018-07-11 ENCOUNTER — Other Ambulatory Visit: Payer: Self-pay | Admitting: Orthopedic Surgery

## 2018-07-11 DIAGNOSIS — R0781 Pleurodynia: Secondary | ICD-10-CM

## 2018-08-01 ENCOUNTER — Ambulatory Visit
Admission: RE | Admit: 2018-08-01 | Discharge: 2018-08-01 | Disposition: A | Discharge status: Home / Self Care | Payer: Medicare Other | Source: Ambulatory Visit | Attending: Orthopedic Surgery | Admitting: Orthopedic Surgery

## 2018-08-01 ENCOUNTER — Other Ambulatory Visit: Payer: Self-pay

## 2018-08-01 DIAGNOSIS — R0781 Pleurodynia: Secondary | ICD-10-CM

## 2018-10-20 DIAGNOSIS — H25812 Combined forms of age-related cataract, left eye: Secondary | ICD-10-CM | POA: Diagnosis not present

## 2018-10-20 DIAGNOSIS — H25811 Combined forms of age-related cataract, right eye: Secondary | ICD-10-CM | POA: Diagnosis not present

## 2018-11-12 DIAGNOSIS — H25812 Combined forms of age-related cataract, left eye: Secondary | ICD-10-CM | POA: Diagnosis not present

## 2018-11-12 DIAGNOSIS — H25811 Combined forms of age-related cataract, right eye: Secondary | ICD-10-CM | POA: Diagnosis not present

## 2018-11-12 DIAGNOSIS — Z01818 Encounter for other preprocedural examination: Secondary | ICD-10-CM | POA: Diagnosis not present

## 2018-11-20 DIAGNOSIS — H2512 Age-related nuclear cataract, left eye: Secondary | ICD-10-CM | POA: Diagnosis not present

## 2018-11-20 DIAGNOSIS — H25812 Combined forms of age-related cataract, left eye: Secondary | ICD-10-CM | POA: Diagnosis not present

## 2018-12-17 DIAGNOSIS — H2511 Age-related nuclear cataract, right eye: Secondary | ICD-10-CM | POA: Diagnosis not present

## 2018-12-17 DIAGNOSIS — H25811 Combined forms of age-related cataract, right eye: Secondary | ICD-10-CM | POA: Diagnosis not present

## 2018-12-19 ENCOUNTER — Other Ambulatory Visit: Payer: Self-pay

## 2018-12-29 DIAGNOSIS — R300 Dysuria: Secondary | ICD-10-CM | POA: Diagnosis not present

## 2018-12-29 DIAGNOSIS — N390 Urinary tract infection, site not specified: Secondary | ICD-10-CM | POA: Diagnosis not present

## 2019-01-22 ENCOUNTER — Other Ambulatory Visit: Payer: Self-pay | Admitting: Internal Medicine

## 2019-01-22 DIAGNOSIS — Z1231 Encounter for screening mammogram for malignant neoplasm of breast: Secondary | ICD-10-CM

## 2019-03-06 DIAGNOSIS — Z1159 Encounter for screening for other viral diseases: Secondary | ICD-10-CM | POA: Diagnosis not present

## 2019-03-11 DIAGNOSIS — K5730 Diverticulosis of large intestine without perforation or abscess without bleeding: Secondary | ICD-10-CM | POA: Diagnosis not present

## 2019-03-11 DIAGNOSIS — Z86010 Personal history of colonic polyps: Secondary | ICD-10-CM | POA: Diagnosis not present

## 2019-03-13 ENCOUNTER — Ambulatory Visit
Admission: RE | Admit: 2019-03-13 | Discharge: 2019-03-13 | Disposition: A | Discharge status: Home / Self Care | Payer: Medicare Other | Source: Ambulatory Visit | Attending: Internal Medicine | Admitting: Internal Medicine

## 2019-03-13 ENCOUNTER — Other Ambulatory Visit: Payer: Self-pay

## 2019-03-13 DIAGNOSIS — Z1231 Encounter for screening mammogram for malignant neoplasm of breast: Secondary | ICD-10-CM

## 2019-04-10 IMAGING — CT CT CHEST WITHOUT CONTRAST
1 series · 16 of 34 positions shown, 20 images · non-contrast
Comparison: PET-CT, 02/17/2010, rib radiographs, 05/05/2018

CLINICAL DATA: Right-sided rib pain, increasing, history of right
fifth through seventh rib fractures, history of lymphoma

EXAM:
CT CHEST WITHOUT CONTRAST
TECHNIQUE: Multidetector CT imaging of the chest was performed following the
standard protocol without IV contrast.

[Series 2: chest w/(date) · axial · 0.65mm/px · z∈[-275,+1]mm · 16 of 156 slices shown, 20 images]
[im 12/156  mediastinal]
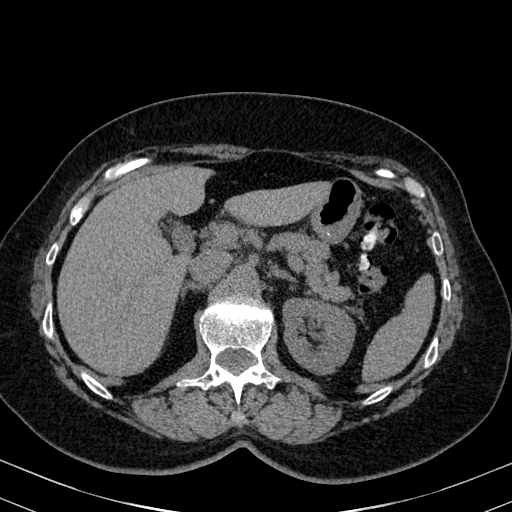
[im 12/156  lung]
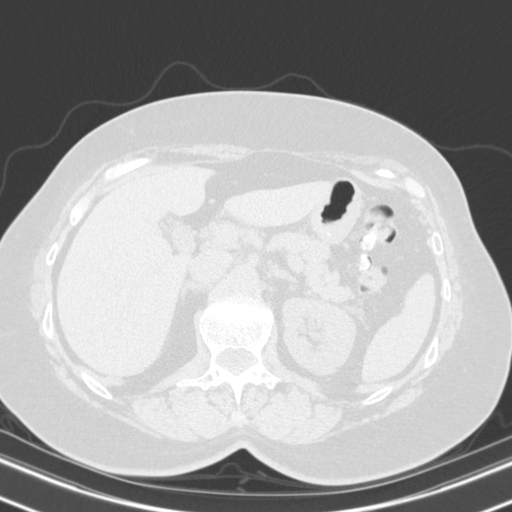
[im 23/156  lung]
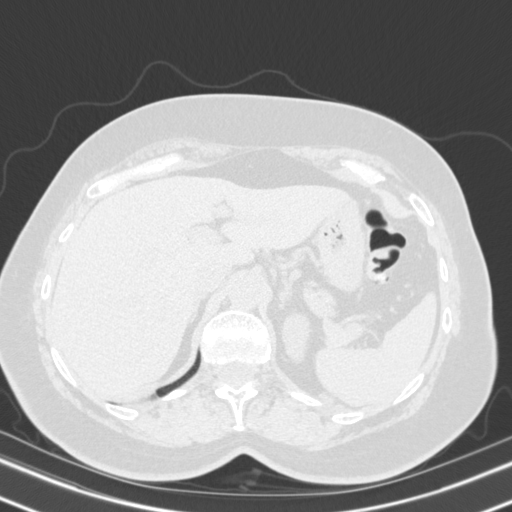
[im 32/156  lung]
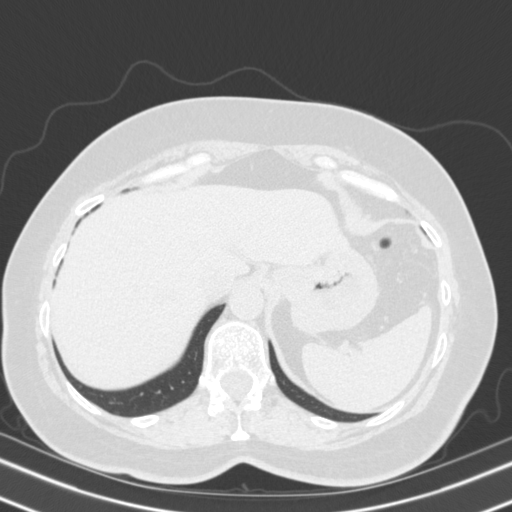
[im 41/156  lung]
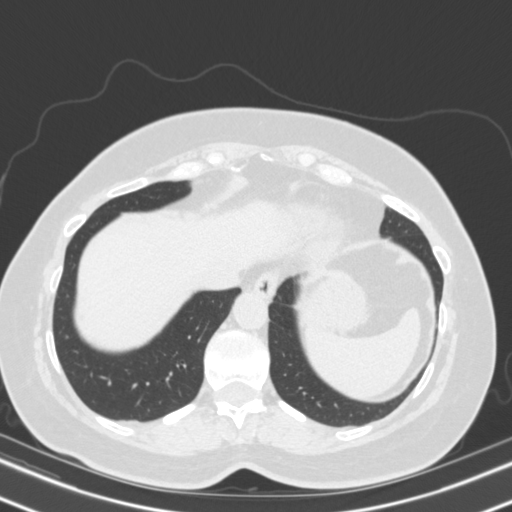
[im 52/156  mediastinal]
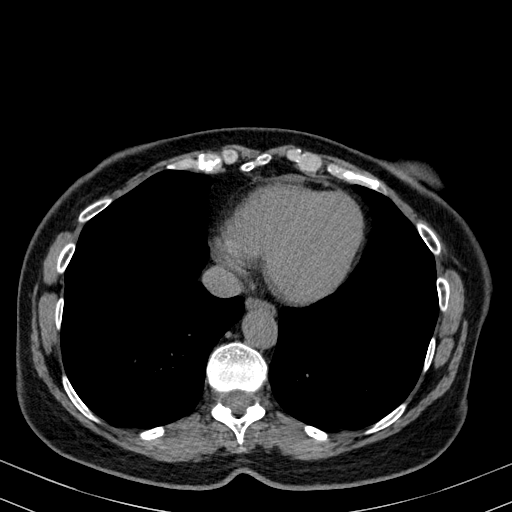
[im 52/156  lung]
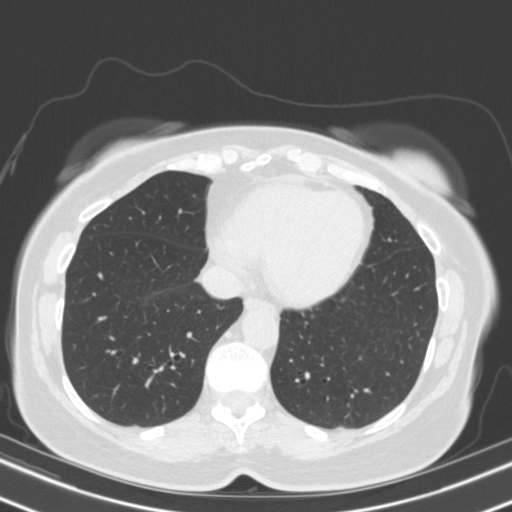
[im 63/156  lung]
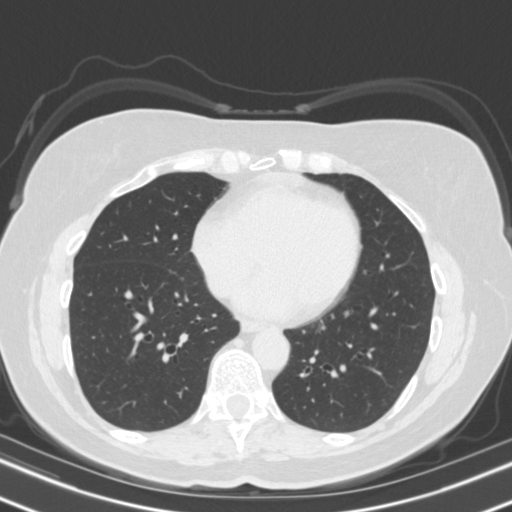
[im 69/156  lung]
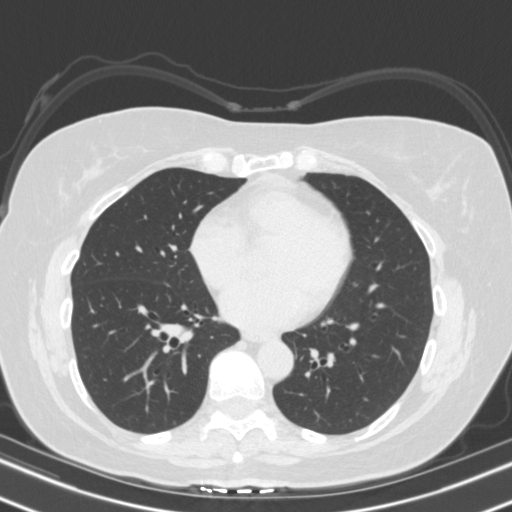
[im 75/156  lung]
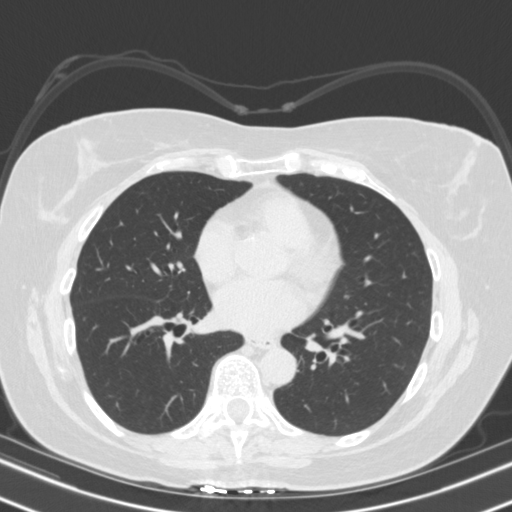
[im 83/156  mediastinal]
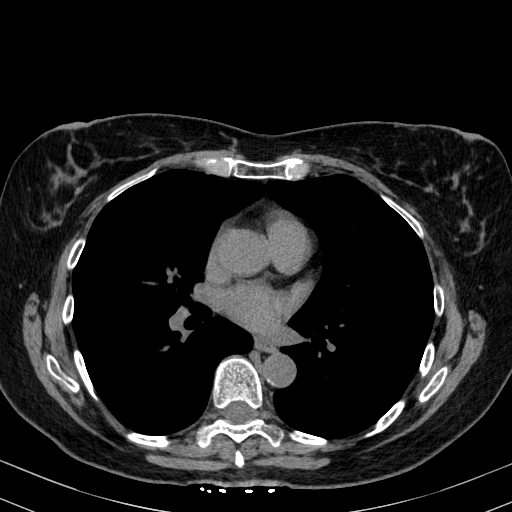
[im 83/156  lung]
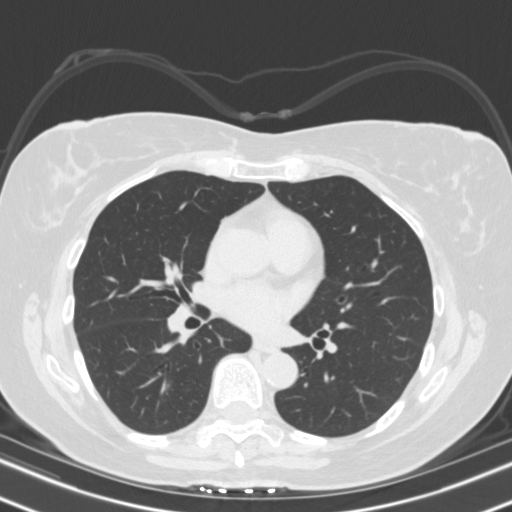
[im 92/156  lung]
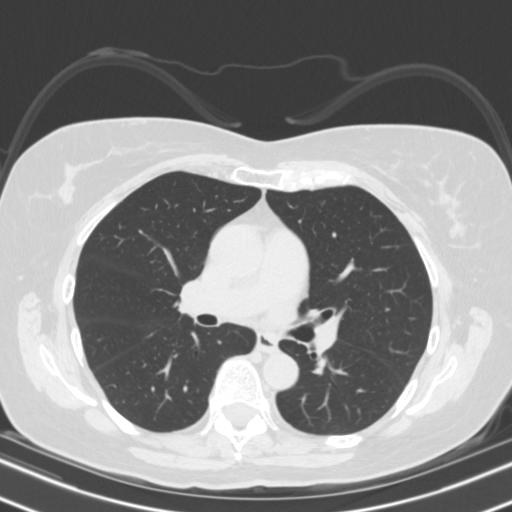
[im 98/156  lung]
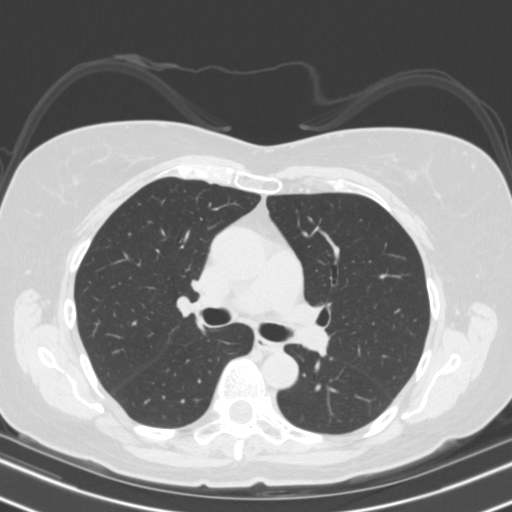
[im 110/156  lung]
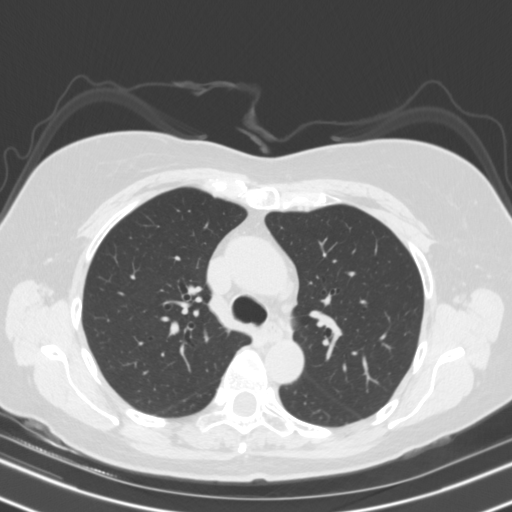
[im 121/156  mediastinal]
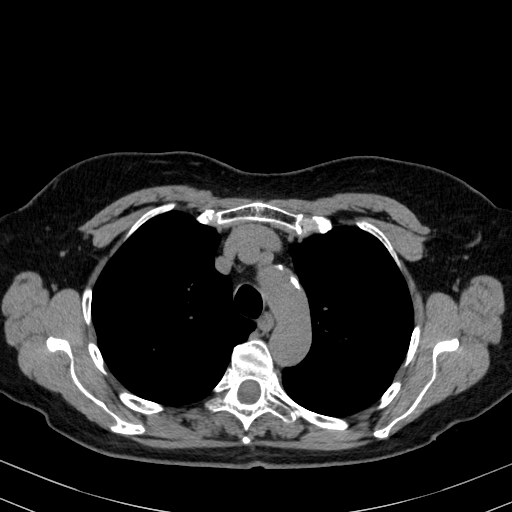
[im 121/156  lung]
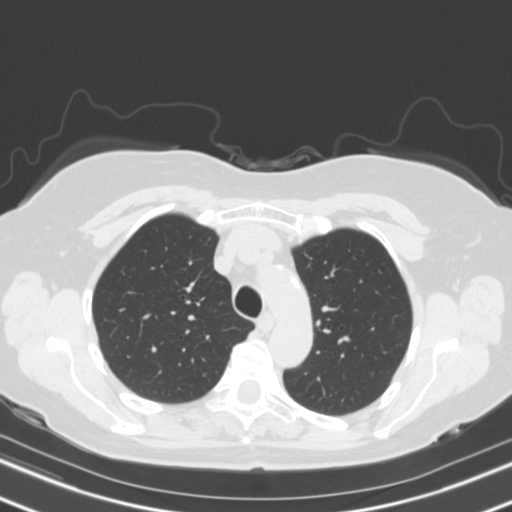
[im 127/156  lung]
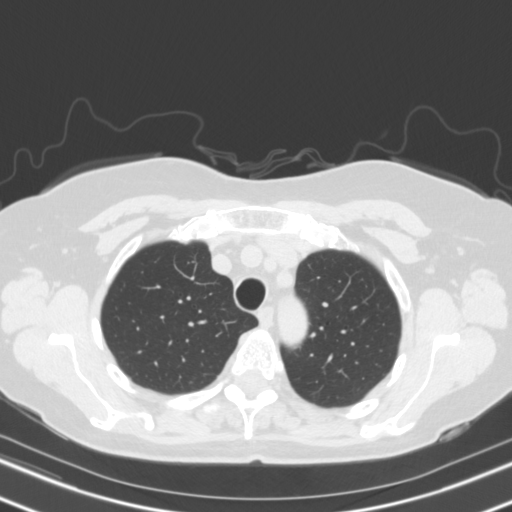
[im 138/156  lung]
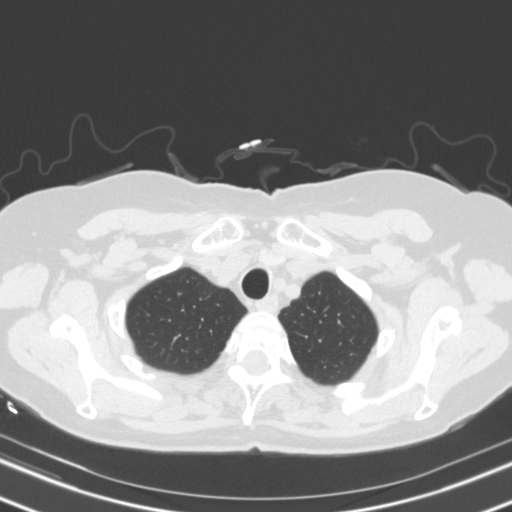
[im 150/156  lung]
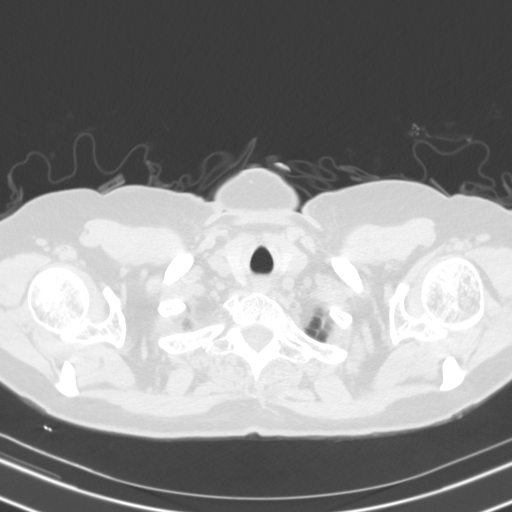

[16 of 34 positions shown; findings below may reference images not displayed]

FINDINGS: Cardiovascular: Scattered aortic atherosclerosis. Normal heart size.
No pericardial effusion.

Mediastinum/Nodes: No enlarged mediastinal, hilar, or axillary lymph
nodes. Thyroid gland, trachea, and esophagus demonstrate no
significant findings.

Lungs/Pleura: Lungs are clear. No pleural effusion or pneumothorax.

Upper Abdomen: No acute abnormality.

Musculoskeletal: No chest wall mass or suspicious bone lesions
identified.
IMPRESSION: No evidence of acute abnormality in the chest. No findings to
explain right-sided rib pain.

## 2019-06-18 ENCOUNTER — Ambulatory Visit: Payer: Medicare Other

## 2019-06-27 ENCOUNTER — Ambulatory Visit: Payer: Medicare Other | Attending: Internal Medicine

## 2019-06-27 DIAGNOSIS — Z23 Encounter for immunization: Secondary | ICD-10-CM | POA: Insufficient documentation

## 2019-06-27 NOTE — Progress Notes (Signed)
   Covid-19 Vaccination Clinic  Name:  BETANYA PAULIS    MRN: [id number] DOB: 1951/05/17  06/27/2019  Ms. Dolsen was observed post Covid-19 immunization for 15 minutes without incidence. She was provided with Vaccine Information Sheet and instruction to access the V-Safe system.   Ms. Strawderman was instructed to call 911 with any severe reactions post vaccine: Marland Kitchen Difficulty breathing  . Swelling of your face and throat  . A fast heartbeat  . A bad rash all over your body  . Dizziness and weakness    Immunizations Administered    Name Date Dose VIS Date Route   Pfizer COVID-19 Vaccine 06/27/2019  8:20 AM 0.3 mL 05/01/2019 Intramuscular   Manufacturer: [place]   Lot: [id number]   [place]: [id number]

## 2019-06-29 DIAGNOSIS — E7800 Pure hypercholesterolemia, unspecified: Secondary | ICD-10-CM | POA: Diagnosis not present

## 2019-07-02 DIAGNOSIS — Z0000 Encounter for general adult medical examination without abnormal findings: Secondary | ICD-10-CM | POA: Diagnosis not present

## 2019-07-02 DIAGNOSIS — L578 Other skin changes due to chronic exposure to nonionizing radiation: Secondary | ICD-10-CM | POA: Diagnosis not present

## 2019-07-02 DIAGNOSIS — L570 Actinic keratosis: Secondary | ICD-10-CM | POA: Diagnosis not present

## 2019-07-02 DIAGNOSIS — G2581 Restless legs syndrome: Secondary | ICD-10-CM | POA: Diagnosis not present

## 2019-07-02 DIAGNOSIS — B354 Tinea corporis: Secondary | ICD-10-CM | POA: Diagnosis not present

## 2019-07-02 DIAGNOSIS — Z2821 Immunization not carried out because of patient refusal: Secondary | ICD-10-CM | POA: Diagnosis not present

## 2019-07-02 DIAGNOSIS — E7800 Pure hypercholesterolemia, unspecified: Secondary | ICD-10-CM | POA: Diagnosis not present

## 2019-07-02 DIAGNOSIS — H8110 Benign paroxysmal vertigo, unspecified ear: Secondary | ICD-10-CM | POA: Diagnosis not present

## 2019-07-02 DIAGNOSIS — M8580 Other specified disorders of bone density and structure, unspecified site: Secondary | ICD-10-CM | POA: Diagnosis not present

## 2019-07-02 DIAGNOSIS — Z9221 Personal history of antineoplastic chemotherapy: Secondary | ICD-10-CM | POA: Diagnosis not present

## 2019-07-21 ENCOUNTER — Ambulatory Visit: Payer: Medicare Other | Attending: Internal Medicine

## 2019-07-21 ENCOUNTER — Ambulatory Visit: Payer: Medicare Other

## 2019-07-21 DIAGNOSIS — Z23 Encounter for immunization: Secondary | ICD-10-CM | POA: Insufficient documentation

## 2019-07-21 NOTE — Progress Notes (Signed)
   Covid-19 Vaccination Clinic  Name:  Elaine Henderson    MRN: [id number] DOB: [birth year]  07/21/2019  [name] was observed post Covid-19 immunization for 15 minutes without incident. She was provided with Vaccine Information Sheet and instruction to access the V-Safe system.   [name] was instructed to call 911 with any severe reactions post vaccine: Marland Kitchen Difficulty breathing  . Swelling of face and throat  . A fast heartbeat  . A bad rash all over body  . Dizziness and weakness   Immunizations Administered    Name Date Dose VIS Date Route   Pfizer COVID-19 Vaccine 07/21/2019  3:20 PM 0.3 mL 05/01/2019 Intramuscular   Manufacturer: [place]   Lot: [id number]   [place]: [id number]

## 2019-08-14 DIAGNOSIS — D2271 Melanocytic nevi of right lower limb, including hip: Secondary | ICD-10-CM | POA: Diagnosis not present

## 2019-08-14 DIAGNOSIS — D1801 Hemangioma of skin and subcutaneous tissue: Secondary | ICD-10-CM | POA: Diagnosis not present

## 2019-08-14 DIAGNOSIS — L570 Actinic keratosis: Secondary | ICD-10-CM | POA: Diagnosis not present

## 2019-08-14 DIAGNOSIS — D2262 Melanocytic nevi of left upper limb, including shoulder: Secondary | ICD-10-CM | POA: Diagnosis not present

## 2019-08-14 DIAGNOSIS — L821 Other seborrheic keratosis: Secondary | ICD-10-CM | POA: Diagnosis not present

## 2019-08-14 DIAGNOSIS — D2261 Melanocytic nevi of right upper limb, including shoulder: Secondary | ICD-10-CM | POA: Diagnosis not present

## 2019-08-14 DIAGNOSIS — L814 Other melanin hyperpigmentation: Secondary | ICD-10-CM | POA: Diagnosis not present

## 2019-08-14 DIAGNOSIS — D2272 Melanocytic nevi of left lower limb, including hip: Secondary | ICD-10-CM | POA: Diagnosis not present

## 2019-08-14 DIAGNOSIS — D2239 Melanocytic nevi of other parts of face: Secondary | ICD-10-CM | POA: Diagnosis not present

## 2019-08-25 DIAGNOSIS — E7800 Pure hypercholesterolemia, unspecified: Secondary | ICD-10-CM | POA: Diagnosis not present

## 2019-08-25 DIAGNOSIS — M8589 Other specified disorders of bone density and structure, multiple sites: Secondary | ICD-10-CM | POA: Diagnosis not present

## 2019-08-25 DIAGNOSIS — R030 Elevated blood-pressure reading, without diagnosis of hypertension: Secondary | ICD-10-CM | POA: Diagnosis not present

## 2019-08-25 DIAGNOSIS — M85851 Other specified disorders of bone density and structure, right thigh: Secondary | ICD-10-CM | POA: Diagnosis not present

## 2019-12-14 DIAGNOSIS — M1712 Unilateral primary osteoarthritis, left knee: Secondary | ICD-10-CM | POA: Diagnosis not present

## 2019-12-14 DIAGNOSIS — E7800 Pure hypercholesterolemia, unspecified: Secondary | ICD-10-CM | POA: Diagnosis not present

## 2019-12-14 DIAGNOSIS — M25561 Pain in right knee: Secondary | ICD-10-CM | POA: Diagnosis not present

## 2019-12-14 DIAGNOSIS — I959 Hypotension, unspecified: Secondary | ICD-10-CM | POA: Diagnosis not present

## 2019-12-14 DIAGNOSIS — Z7184 Encounter for health counseling related to travel: Secondary | ICD-10-CM | POA: Diagnosis not present

## 2019-12-14 DIAGNOSIS — M25562 Pain in left knee: Secondary | ICD-10-CM | POA: Diagnosis not present

## 2019-12-14 DIAGNOSIS — M1711 Unilateral primary osteoarthritis, right knee: Secondary | ICD-10-CM | POA: Diagnosis not present

## 2020-01-06 ENCOUNTER — Encounter: Payer: Self-pay | Admitting: Family Medicine

## 2020-01-06 ENCOUNTER — Other Ambulatory Visit: Payer: Self-pay

## 2020-01-06 ENCOUNTER — Ambulatory Visit: Payer: Self-pay

## 2020-01-06 ENCOUNTER — Ambulatory Visit (INDEPENDENT_AMBULATORY_CARE_PROVIDER_SITE_OTHER): Payer: Medicare Other | Admitting: Family Medicine

## 2020-01-06 VITALS — BP 140/80 | HR 77 | Ht 66.0 in | Wt 155.0 lb

## 2020-01-06 DIAGNOSIS — M67432 Ganglion, left wrist: Secondary | ICD-10-CM | POA: Diagnosis not present

## 2020-01-06 DIAGNOSIS — M170 Bilateral primary osteoarthritis of knee: Secondary | ICD-10-CM | POA: Diagnosis not present

## 2020-01-06 DIAGNOSIS — M25562 Pain in left knee: Secondary | ICD-10-CM | POA: Diagnosis not present

## 2020-01-06 DIAGNOSIS — M25532 Pain in left wrist: Secondary | ICD-10-CM | POA: Diagnosis not present

## 2020-01-06 DIAGNOSIS — G8929 Other chronic pain: Secondary | ICD-10-CM | POA: Diagnosis not present

## 2020-01-06 DIAGNOSIS — Z8579 Personal history of other malignant neoplasms of lymphoid, hematopoietic and related tissues: Secondary | ICD-10-CM | POA: Diagnosis not present

## 2020-01-06 DIAGNOSIS — M25561 Pain in right knee: Secondary | ICD-10-CM

## 2020-01-06 NOTE — Patient Instructions (Signed)
Thank you for coming in today.  Use compression sleeve.  Use voltaren gel.  Call or go to the ER if you develop a large red swollen joint with extreme pain or oozing puss.   I recommend you obtained a compression sleeve to help with your joint problems. There are many options on the market however I recommend obtaining a full knee Body Helix compression sleeve.  You can find information (including how to appropriate measure yourself for sizing) can be found at www.Body http://www.lambert.com/.  Many of these products are health savings account (HSA) eligible.   You can use the compression sleeve at any time throughout the day but is most important to use while being active as well as for 2 hours post-activity.   It is appropriate to ice following activity with the compression sleeve in place.  Return prior to your trip for knee injections.

## 2020-01-06 NOTE — Progress Notes (Signed)
Subjective:    CC: B knee and L wrist pain  I, Molly Weber, LAT, ATC, am serving as scribe for Dr. [name].  HPI: Pt is a 69 y/o female presenting w/ c/o B knee pain and L wrist pain.  B knee pain: Chronic pain that worsened after doing a lot of hiking in preparation for a trip w/ her husband where they plan [place]. -Knee swelling: possibly in the R knee -Knee mechanical symptoms: No -Aggravating factors: hiking; transitioning to stand after sitting for a long time; stairs, especially downstairs -Treatments tried: Nothing  L wrist pain: L radial-sided wrist pain w/ a cyst/bump -Swelling: yes -Mechanical symptoms: Non -Aggravating factors: L wrist ulnar deviation; gripping -Treatments tried: nothing  Pertinent review of Systems: No fevers or chills  Relevant historical information: History lymphoma 2009   Objective:    Vitals:   01/06/20 1025  BP: 140/80  Pulse: 77  SpO2: 98%   General: Well Developed, well nourished, and in no acute distress.   MSK: Right knee normal-appearing no significant effusion. Normal motion. Nontender. Intact strength. Stable ligamentous exam.  Left knee normal-appearing no significant effusion. Normal motion. Nontender. Intact strength. Stable ligamentous exam.  Left wrist normal-appearing Small palpable nodule dorsal radial wrist at radial styloid.  Tender to palpation. Normal wrist motion and strength. Pulses cap refill and sensation are intact distally.  Lab and Radiology Results  EXAM: RIGHT KNEE - 1-2 VIEW  COMPARISON: None.  FINDINGS: Mild joint space narrowing in the medial and lateral compartments. No acute bony abnormality. Specifically, no fracture, subluxation, or dislocation. No joint effusion.  IMPRESSION: Mild joint space narrowing as above. No acute bony abnormality.   Electronically Signed By: Rolm Baptise M.D. On: 12/14/2019  12:01  EXAM: LEFT KNEE - 1-2 VIEW  COMPARISON: None.  FINDINGS: Mild joint space narrowing in the medial and lateral compartments. No acute bony abnormality. Specifically, no fracture, subluxation, or dislocation. No joint effusion.  IMPRESSION: Mild joint space narrowing. No acute bony abnormality.   Electronically Signed By: Rolm Baptise M.D. On: 12/14/2019 12:00  I, [name], personally (independently) visualized and performed the interpretation of the images attached in this note.  Diagnostic Limited MSK Ultrasound of: Right knee Quad tendon intact normal-appearing Trace joint effusion superior patellar space. Patellar tendon normal. Lateral joint line normal-appearing Medial joint line narrowed degenerative appearing medial meniscus. Posterior knee no Baker's cyst. Impression: Medial compartment DJD  Diagnostic Limited MSK Ultrasound of: Left knee Quad tendon normal-appearing Trace joint effusion superior patellar space. Patellar tendon normal-appearing Lateral joint line normal. Medial joint line neurovascular.  Medial meniscus. Posterior knee no Baker's cyst. Impression: Medial compartment DJD.  Diagnostic Limited MSK Ultrasound of: Left dorsal wrist Small bilobed ganglion cyst superficial to first dorsal wrist compartment visible. No increased vascular activity. Impression: Ganglion cyst   Procedure: Real-time Ultrasound Guided Injection of left dorsal wrist ganglion cyst Device: Philips Affiniti 50G Images permanently stored and available for review in the ultrasound unit. Verbal informed consent obtained.  Discussed risks and benefits of procedure. Warned about infection bleeding damage to structures skin hypopigmentation and fat atrophy among others. Patient expresses understanding and agreement Time-out conducted.   Noted no overlying erythema, induration, or other signs of local infection.   Skin prepped in a sterile fashion.   Local anesthesia:  Topical Ethyl chloride.   With sterile technique and under real time ultrasound guidance:  40 mg of Kenalog and 1  mL of lidocaine injected easily.   Completed without difficulty   Pain immediately resolved suggesting accurate placement of the medication.   Advised to call if fevers/chills, erythema, induration, drainage, or persistent bleeding.   Images permanently stored and available for review in the ultrasound unit.  Impression: Technically successful ultrasound guided injection.       Impression and Recommendations:    Assessment and Plan: 69 y.o. female with left wrist pain due to ganglion cyst. Injected today. Relative rest recheck back as needed.  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 Possible degenerative meniscus tear is present as well. Plan for compression sleeve and quad strengthening exercises of Voltaren gel. Patient is leaving for a trip with a lot of hiking starting in the beginning of next month. Recommend that she return just prior to her trip for steroid injections if desired.  Recheck back as needed..   Orders Placed This Encounter  Procedures  . [place] LIMITED JOINT SPACE STRUCTURES LOW BILAT(NO LINKED CHARGES)    Order Specific Question:   Reason for Exam (SYMPTOM  OR DIAGNOSIS REQUIRED)    Answer:   B knee pain    Order Specific Question:   Preferred imaging location?    Answer:   [facility]   No orders of the defined types were placed in this encounter.   Discussed warning signs or symptoms. Please see discharge instructions. Patient expresses understanding.   The above documentation has been reviewed and is accurate and complete [name], M.D.

## 2020-01-22 DIAGNOSIS — Z20822 Contact with and (suspected) exposure to covid-19: Secondary | ICD-10-CM | POA: Diagnosis not present

## 2020-01-26 ENCOUNTER — Ambulatory Visit (INDEPENDENT_AMBULATORY_CARE_PROVIDER_SITE_OTHER): Payer: Medicare Other | Admitting: Family Medicine

## 2020-01-26 ENCOUNTER — Other Ambulatory Visit: Payer: Self-pay

## 2020-01-26 ENCOUNTER — Encounter: Payer: Self-pay | Admitting: Family Medicine

## 2020-01-26 ENCOUNTER — Ambulatory Visit: Payer: Self-pay

## 2020-01-26 VITALS — BP 130/80 | HR 70 | Ht 66.0 in | Wt 154.0 lb

## 2020-01-26 DIAGNOSIS — M170 Bilateral primary osteoarthritis of knee: Secondary | ICD-10-CM

## 2020-01-26 DIAGNOSIS — M25561 Pain in right knee: Secondary | ICD-10-CM | POA: Diagnosis not present

## 2020-01-26 DIAGNOSIS — M25562 Pain in left knee: Secondary | ICD-10-CM

## 2020-01-26 NOTE — Patient Instructions (Signed)
Thank you for coming in today. Call or go to the ER if you develop a large red swollen joint with extreme pain or oozing puss.  Recheck with me as needed.  Good luck with whatever the future holds.   We can do the knee injections every 3 months if needed.

## 2020-01-26 NOTE — Progress Notes (Signed)
I, Wendy Poet, LAT, ATC, am serving as scribe for Dr. [name].  Elaine Henderson is a 69 y.o. female who presents to [place] at [facility] today for f/u of B knee pain and for B knee injections.  She was last seen by Dr. [name] on 01/06/20 for her B knees and L wrist and was advised to purchase knee compression sleeves and to return for knee injections prior to her hiking trip.  Since her last visit, pt reports that her B knee pain remains about the same.  She states that she got the knee compression sleeves but notes that they were uncomfortable at the back of her knee and felt like they were going to cause a blister.  She notes the aching trip that she had plan to trial the national parks will have to be postponed.  Her husband had an episode of transient global amnesia which needs work-up and her mother-in-law is at the end of her life in a memory care unit.   Pertinent review of systems: No fevers or chills  Relevant historical information: Arthritis.  History lymphoma.   Exam:  BP 130/80 (BP Location: Left Arm, Patient Position: Sitting, Cuff Size: Normal)   Pulse 70   Ht 5\' 6"  (1.676 m)   Wt 154 lb (69.9 kg)   SpO2 98%   BMI 24.86 kg/m  General: Well Developed, well nourished, and in no acute distress.   MSK: Right knee no effusion normal-appearing Normal motion with crepitation.  Stable ligamentous exam.  Left knee no effusion normal-appearing Normal motion with crepitation stable ligamentous exam.    Lab and Radiology Results Procedure: Real-time Ultrasound Guided Injection of right knee lateral superior patellar space Device: Philips Affiniti 50G Images permanently stored and available for review in PACS Verbal informed consent obtained.  Discussed risks and benefits of procedure. Warned about infection bleeding damage to structures skin hypopigmentation and fat atrophy among others. Patient expresses understanding and agreement Time-out  conducted.   Noted no overlying erythema, induration, or other signs of local infection.   Skin prepped in a sterile fashion.   Local anesthesia: Topical Ethyl chloride.   With sterile technique and under real time ultrasound guidance:  40 mg of Kenalog and 2 mL of Marcaine injected easily.   Completed without difficulty   Pain immediately resolved suggesting accurate placement of the medication.   Advised to call if fevers/chills, erythema, induration, drainage, or persistent bleeding.   Images permanently stored and available for review in the ultrasound unit.  Impression: Technically successful ultrasound guided injection.   Procedure: Real-time Ultrasound Guided Injection of left knee lateral superior patellar space Device: Philips Affiniti 50G Images permanently stored and available for review in PACS Verbal informed consent obtained.  Discussed risks and benefits of procedure. Warned about infection bleeding damage to structures skin hypopigmentation and fat atrophy among others. Patient expresses understanding and agreement Time-out conducted.   Noted no overlying erythema, induration, or other signs of local infection.   Skin prepped in a sterile fashion.   Local anesthesia: Topical Ethyl chloride.   With sterile technique and under real time ultrasound guidance:  40 mg of Kenalog and 2 mL of Marcaine injected easily.   Completed without difficulty   Pain immediately resolved suggesting accurate placement of the medication.   Advised to call if fevers/chills, erythema, induration, drainage, or persistent bleeding.   Images permanently stored and available for review in the ultrasound unit.  Impression: Technically successful ultrasound guided  injection.      Assessment and Plan: 69 y.o. female with bilateral knee pain due to DJD. Plan today for bilateral steroid injections.  Recheck back as needed.  Precautions reviewed.  PDMP not reviewed this encounter. Orders Placed  This Encounter  Procedures  . [place] LIMITED JOINT SPACE STRUCTURES LOW RIGHT(NO LINKED CHARGES)    Standing Status:   Future    Number of Occurrences:   1    Standing Expiration Date:   01/25/2021    Order Specific Question:   Reason for Exam (SYMPTOM  OR DIAGNOSIS REQUIRED)    Answer:   Bilateral knee pain    Order Specific Question:   Preferred imaging location?    Answer:   [place]   No orders of the defined types were placed in this encounter.    Discussed warning signs or symptoms. Please see discharge instructions. Patient expresses understanding.

## 2020-02-26 ENCOUNTER — Ambulatory Visit (INDEPENDENT_AMBULATORY_CARE_PROVIDER_SITE_OTHER): Payer: Medicare Other | Admitting: Cardiovascular Disease

## 2020-02-26 ENCOUNTER — Encounter: Payer: Self-pay | Admitting: Cardiovascular Disease

## 2020-02-26 ENCOUNTER — Other Ambulatory Visit: Payer: Self-pay

## 2020-02-26 VITALS — BP 152/84 | HR 74 | Ht 65.0 in | Wt 154.8 lb

## 2020-02-26 DIAGNOSIS — R42 Dizziness and giddiness: Secondary | ICD-10-CM | POA: Diagnosis not present

## 2020-02-26 DIAGNOSIS — E785 Hyperlipidemia, unspecified: Secondary | ICD-10-CM | POA: Insufficient documentation

## 2020-02-26 DIAGNOSIS — E782 Mixed hyperlipidemia: Secondary | ICD-10-CM

## 2020-02-26 NOTE — Progress Notes (Signed)
02/26/2020 Elaine Henderson   1950-09-27  [id number]  Primary Physician Deland Pretty, MD Primary Cardiologist: Lorretta Harp MD Lupe Carney, [place]  HPI:  Elaine Henderson is a 69 y.o. married Caucasian female mother of 3 children, grandmother of 3 grandchildren is currently retired from the [employer].  She was referred by Dr. Shelia Media for cardiovascular evaluation because of dizziness when she hikes in the heat.  Her only risk factor is treated hyperlipidemia.  Both of her parents did have myocardial infarction's.  She is never had a heart attack or stroke.  She denies chest pain or shortness of breath.  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  Since being retired she and her husband have been hiking frequently she notices that when she is in the heat and walking she gets presyncopal.  It sounds like she may be dehydrated.   Current Meds  Medication Sig  . Calcium Carbonate-Vitamin D (CALTRATE 600+D) 600-400 MG-UNIT per tablet Take 1 tablet by mouth daily.  . Multiple Vitamin (MULTIVITAMIN) capsule Take 1 capsule by mouth daily.  . rosuvastatin (CRESTOR) 10 MG tablet Take 10 mg by mouth daily.     Allergies  Allergen Reactions  . Nitrofurantoin Macrocrystal     Other reaction(s): Vomiting  . Nitrofurantoin Monohyd Macro Nausea Only    Social History   Socioeconomic History  . Marital status: Married    Spouse name: Not on file  . Number of children: Not on file  . Years of education: Not on file  . Highest education level: Not on file  Occupational History  . Not on file  Tobacco Use  . Smoking status: Never Smoker  . Smokeless tobacco: Never Used  . Tobacco comment: never used tobacco  Substance and Sexual Activity  . Alcohol use: Not on file  . Drug use: Not on file  . Sexual activity: Not on file  Other Topics Concern  . Not on file  Social History Narrative  .  Not on file   Social Determinants of Health   Financial Resource Strain:   . Difficulty of Paying Living Expenses: Not on file  Food Insecurity:   . Worried About Charity fundraiser in the Last Year: Not on file  . Ran Out of Food in the Last Year: Not on file  Transportation Needs:   . Lack of Transportation (Medical): Not on file  . Lack of Transportation (Non-Medical): Not on file  Physical Activity:   . Days of Exercise per Week: Not on file  . Minutes of Exercise per Session: Not on file  Stress:   . Feeling of Stress : Not on file  Social Connections:   . Frequency of Communication with Friends and Family: Not on file  . Frequency of Social Gatherings with Friends and Family: Not on file  . Attends Religious Services: Not on file  . Active Member of Clubs or Organizations: Not on file  . Attends Archivist Meetings: Not on file  . Marital Status: Not on file  Intimate Partner Violence:   . Fear of Current or Ex-Partner: Not on file  . Emotionally Abused: Not on file  . Physically Abused: Not on file  . Sexually Abused: Not on file     Review of Systems: General: negative for chills, fever, night sweats or weight changes.  Cardiovascular: negative for chest pain, dyspnea on exertion, edema, orthopnea,  palpitations, paroxysmal nocturnal dyspnea or shortness of breath Dermatological: negative for rash Respiratory: negative for cough or wheezing Urologic: negative for hematuria Abdominal: negative for nausea, vomiting, diarrhea, bright red blood per rectum, melena, or hematemesis Neurologic: negative for visual changes, syncope, or dizziness All other systems reviewed and are otherwise negative except as noted above.    Blood pressure (!) 152/84, pulse 74, height 5\' 5"  (1.651 m), weight 154 lb 12.8 oz (70.2 kg), SpO2 99 %.  General appearance: alert and no distress Neck: no adenopathy, no carotid bruit, no JVD, supple, symmetrical, trachea midline and thyroid  not enlarged, symmetric, no tenderness/mass/nodules Lungs: clear to auscultation bilaterally Heart: regular rate and rhythm, S1, S2 normal, no murmur, click, rub or gallop Extremities: extremities normal, atraumatic, no cyanosis or edema Pulses: 2+ and symmetric Skin: Skin color, texture, turgor normal. No rashes or lesions Neurologic: Alert and oriented X 3, normal strength and tone. Normal symmetric reflexes. Normal coordination and gait  EKG sinus rhythm at 74 with nonspecific ST and T wave changes.  Personally reviewed this EKG.  ASSESSMENT AND PLAN:   Dizziness Ms. Feggins was referred to me by Dr. Shelia Media for evaluation of dizziness specifically when she hikes in the heat.  She has no risk factors.  She is not dizzy during other times.  Blood pressures under good control.  I do not know why she gets dizzy during these times but it sounds like she may be either deconditioned and/or dehydrated.  No further testing is required at this time.  Hyperlipidemia Chief on statin therapy with lipid profile performed 12/14/2019 revealing an LDL of 111.      Lorretta Harp MD FACP,FACC,FAHA, [facility] 02/26/2020 2:01 PM

## 2020-02-26 NOTE — Assessment & Plan Note (Signed)
Elaine Henderson was referred to me by Dr. Shelia Media for evaluation of dizziness specifically when she hikes in the heat.  She has no risk factors.  She is not dizzy during other times.  Blood pressures under good control.  I do not know why she gets dizzy during these times but it sounds like she may be either deconditioned and/or dehydrated.  No further testing is required at this time.

## 2020-02-26 NOTE — Patient Instructions (Signed)

## 2020-02-26 NOTE — Assessment & Plan Note (Signed)
Chief on statin therapy with lipid profile performed 12/14/2019 revealing an LDL of 111.

## 2020-03-14 DIAGNOSIS — L578 Other skin changes due to chronic exposure to nonionizing radiation: Secondary | ICD-10-CM | POA: Diagnosis not present

## 2020-03-22 DIAGNOSIS — R300 Dysuria: Secondary | ICD-10-CM | POA: Diagnosis not present

## 2020-03-22 DIAGNOSIS — N390 Urinary tract infection, site not specified: Secondary | ICD-10-CM | POA: Diagnosis not present

## 2020-03-31 DIAGNOSIS — Z23 Encounter for immunization: Secondary | ICD-10-CM | POA: Diagnosis not present

## 2020-06-01 ENCOUNTER — Other Ambulatory Visit: Payer: Self-pay | Admitting: Internal Medicine

## 2020-06-01 DIAGNOSIS — Z1231 Encounter for screening mammogram for malignant neoplasm of breast: Secondary | ICD-10-CM

## 2020-07-11 DIAGNOSIS — H9201 Otalgia, right ear: Secondary | ICD-10-CM | POA: Diagnosis not present

## 2020-07-13 ENCOUNTER — Ambulatory Visit: Payer: Medicare Other

## 2020-07-14 DIAGNOSIS — H9201 Otalgia, right ear: Secondary | ICD-10-CM | POA: Diagnosis not present

## 2020-07-16 ENCOUNTER — Other Ambulatory Visit: Payer: Self-pay

## 2020-07-16 ENCOUNTER — Ambulatory Visit
Admission: RE | Admit: 2020-07-16 | Discharge: 2020-07-16 | Disposition: A | Discharge status: Home / Self Care | Payer: Medicare Other | Source: Ambulatory Visit | Attending: Internal Medicine | Admitting: Internal Medicine

## 2020-07-16 DIAGNOSIS — Z1231 Encounter for screening mammogram for malignant neoplasm of breast: Secondary | ICD-10-CM

## 2020-08-01 DIAGNOSIS — E7800 Pure hypercholesterolemia, unspecified: Secondary | ICD-10-CM | POA: Diagnosis not present

## 2020-08-04 DIAGNOSIS — Z0000 Encounter for general adult medical examination without abnormal findings: Secondary | ICD-10-CM | POA: Diagnosis not present

## 2020-08-04 DIAGNOSIS — M79604 Pain in right leg: Secondary | ICD-10-CM | POA: Diagnosis not present

## 2020-08-04 DIAGNOSIS — M8580 Other specified disorders of bone density and structure, unspecified site: Secondary | ICD-10-CM | POA: Diagnosis not present

## 2020-08-04 DIAGNOSIS — G2581 Restless legs syndrome: Secondary | ICD-10-CM | POA: Diagnosis not present

## 2020-08-04 DIAGNOSIS — E7800 Pure hypercholesterolemia, unspecified: Secondary | ICD-10-CM | POA: Diagnosis not present

## 2020-08-04 DIAGNOSIS — Z0001 Encounter for general adult medical examination with abnormal findings: Secondary | ICD-10-CM | POA: Diagnosis not present

## 2020-08-05 ENCOUNTER — Other Ambulatory Visit: Payer: Self-pay | Admitting: Internal Medicine

## 2020-08-05 DIAGNOSIS — E7800 Pure hypercholesterolemia, unspecified: Secondary | ICD-10-CM

## 2020-08-10 DIAGNOSIS — M25661 Stiffness of right knee, not elsewhere classified: Secondary | ICD-10-CM | POA: Diagnosis not present

## 2020-08-10 DIAGNOSIS — M25561 Pain in right knee: Secondary | ICD-10-CM | POA: Diagnosis not present

## 2020-08-12 DIAGNOSIS — M25661 Stiffness of right knee, not elsewhere classified: Secondary | ICD-10-CM | POA: Diagnosis not present

## 2020-08-12 DIAGNOSIS — M25561 Pain in right knee: Secondary | ICD-10-CM | POA: Diagnosis not present

## 2020-08-16 DIAGNOSIS — M25561 Pain in right knee: Secondary | ICD-10-CM | POA: Diagnosis not present

## 2020-08-16 DIAGNOSIS — M25661 Stiffness of right knee, not elsewhere classified: Secondary | ICD-10-CM | POA: Diagnosis not present

## 2020-08-17 DIAGNOSIS — D2272 Melanocytic nevi of left lower limb, including hip: Secondary | ICD-10-CM | POA: Diagnosis not present

## 2020-08-17 DIAGNOSIS — D2261 Melanocytic nevi of right upper limb, including shoulder: Secondary | ICD-10-CM | POA: Diagnosis not present

## 2020-08-17 DIAGNOSIS — D2262 Melanocytic nevi of left upper limb, including shoulder: Secondary | ICD-10-CM | POA: Diagnosis not present

## 2020-08-17 DIAGNOSIS — D485 Neoplasm of uncertain behavior of skin: Secondary | ICD-10-CM | POA: Diagnosis not present

## 2020-08-17 DIAGNOSIS — L814 Other melanin hyperpigmentation: Secondary | ICD-10-CM | POA: Diagnosis not present

## 2020-08-17 DIAGNOSIS — L821 Other seborrheic keratosis: Secondary | ICD-10-CM | POA: Diagnosis not present

## 2020-08-17 DIAGNOSIS — D1801 Hemangioma of skin and subcutaneous tissue: Secondary | ICD-10-CM | POA: Diagnosis not present

## 2020-08-17 DIAGNOSIS — L570 Actinic keratosis: Secondary | ICD-10-CM | POA: Diagnosis not present

## 2020-08-19 DIAGNOSIS — M25661 Stiffness of right knee, not elsewhere classified: Secondary | ICD-10-CM | POA: Diagnosis not present

## 2020-08-19 DIAGNOSIS — M25561 Pain in right knee: Secondary | ICD-10-CM | POA: Diagnosis not present

## 2020-08-22 ENCOUNTER — Ambulatory Visit
Admission: RE | Admit: 2020-08-22 | Discharge: 2020-08-22 | Disposition: A | Discharge status: Home / Self Care | Payer: No Typology Code available for payment source | Source: Ambulatory Visit | Attending: Internal Medicine | Admitting: Internal Medicine

## 2020-08-22 DIAGNOSIS — I2510 Atherosclerotic heart disease of native coronary artery without angina pectoris: Secondary | ICD-10-CM | POA: Diagnosis not present

## 2020-08-22 DIAGNOSIS — E7800 Pure hypercholesterolemia, unspecified: Secondary | ICD-10-CM

## 2020-08-23 DIAGNOSIS — M25661 Stiffness of right knee, not elsewhere classified: Secondary | ICD-10-CM | POA: Diagnosis not present

## 2020-08-23 DIAGNOSIS — M25561 Pain in right knee: Secondary | ICD-10-CM | POA: Diagnosis not present

## 2020-08-26 DIAGNOSIS — M25561 Pain in right knee: Secondary | ICD-10-CM | POA: Diagnosis not present

## 2020-08-26 DIAGNOSIS — M25661 Stiffness of right knee, not elsewhere classified: Secondary | ICD-10-CM | POA: Diagnosis not present

## 2020-08-30 DIAGNOSIS — M25661 Stiffness of right knee, not elsewhere classified: Secondary | ICD-10-CM | POA: Diagnosis not present

## 2020-08-30 DIAGNOSIS — M25561 Pain in right knee: Secondary | ICD-10-CM | POA: Diagnosis not present

## 2020-09-01 DIAGNOSIS — E7800 Pure hypercholesterolemia, unspecified: Secondary | ICD-10-CM | POA: Diagnosis not present

## 2020-09-02 DIAGNOSIS — M25561 Pain in right knee: Secondary | ICD-10-CM | POA: Diagnosis not present

## 2020-09-02 DIAGNOSIS — M25661 Stiffness of right knee, not elsewhere classified: Secondary | ICD-10-CM | POA: Diagnosis not present

## 2020-09-06 DIAGNOSIS — M25661 Stiffness of right knee, not elsewhere classified: Secondary | ICD-10-CM | POA: Diagnosis not present

## 2020-09-06 DIAGNOSIS — M25561 Pain in right knee: Secondary | ICD-10-CM | POA: Diagnosis not present

## 2020-09-08 DIAGNOSIS — R220 Localized swelling, mass and lump, head: Secondary | ICD-10-CM | POA: Diagnosis not present

## 2020-09-08 DIAGNOSIS — I2510 Atherosclerotic heart disease of native coronary artery without angina pectoris: Secondary | ICD-10-CM | POA: Diagnosis not present

## 2020-09-08 DIAGNOSIS — I2584 Coronary atherosclerosis due to calcified coronary lesion: Secondary | ICD-10-CM | POA: Diagnosis not present

## 2020-09-09 DIAGNOSIS — M25561 Pain in right knee: Secondary | ICD-10-CM | POA: Diagnosis not present

## 2020-09-09 DIAGNOSIS — M25661 Stiffness of right knee, not elsewhere classified: Secondary | ICD-10-CM | POA: Diagnosis not present

## 2020-09-13 DIAGNOSIS — M25661 Stiffness of right knee, not elsewhere classified: Secondary | ICD-10-CM | POA: Diagnosis not present

## 2020-09-13 DIAGNOSIS — M25561 Pain in right knee: Secondary | ICD-10-CM | POA: Diagnosis not present

## 2020-09-16 DIAGNOSIS — M25561 Pain in right knee: Secondary | ICD-10-CM | POA: Diagnosis not present

## 2020-09-16 DIAGNOSIS — M25661 Stiffness of right knee, not elsewhere classified: Secondary | ICD-10-CM | POA: Diagnosis not present

## 2020-09-21 DIAGNOSIS — M25661 Stiffness of right knee, not elsewhere classified: Secondary | ICD-10-CM | POA: Diagnosis not present

## 2020-09-21 DIAGNOSIS — M25561 Pain in right knee: Secondary | ICD-10-CM | POA: Diagnosis not present

## 2020-09-28 DIAGNOSIS — M25561 Pain in right knee: Secondary | ICD-10-CM | POA: Diagnosis not present

## 2020-09-28 DIAGNOSIS — M25661 Stiffness of right knee, not elsewhere classified: Secondary | ICD-10-CM | POA: Diagnosis not present

## 2020-11-07 DIAGNOSIS — M25551 Pain in right hip: Secondary | ICD-10-CM | POA: Diagnosis not present

## 2020-11-07 DIAGNOSIS — M7918 Myalgia, other site: Secondary | ICD-10-CM | POA: Diagnosis not present

## 2020-12-05 DIAGNOSIS — R2689 Other abnormalities of gait and mobility: Secondary | ICD-10-CM | POA: Diagnosis not present

## 2020-12-05 DIAGNOSIS — M25551 Pain in right hip: Secondary | ICD-10-CM | POA: Diagnosis not present

## 2020-12-08 DIAGNOSIS — M25551 Pain in right hip: Secondary | ICD-10-CM | POA: Diagnosis not present

## 2020-12-08 DIAGNOSIS — R2689 Other abnormalities of gait and mobility: Secondary | ICD-10-CM | POA: Diagnosis not present

## 2020-12-12 DIAGNOSIS — R2689 Other abnormalities of gait and mobility: Secondary | ICD-10-CM | POA: Diagnosis not present

## 2020-12-12 DIAGNOSIS — M25551 Pain in right hip: Secondary | ICD-10-CM | POA: Diagnosis not present

## 2020-12-14 DIAGNOSIS — R2689 Other abnormalities of gait and mobility: Secondary | ICD-10-CM | POA: Diagnosis not present

## 2020-12-14 DIAGNOSIS — M25551 Pain in right hip: Secondary | ICD-10-CM | POA: Diagnosis not present

## 2020-12-19 DIAGNOSIS — M25551 Pain in right hip: Secondary | ICD-10-CM | POA: Diagnosis not present

## 2020-12-19 DIAGNOSIS — R2689 Other abnormalities of gait and mobility: Secondary | ICD-10-CM | POA: Diagnosis not present

## 2020-12-21 DIAGNOSIS — R2689 Other abnormalities of gait and mobility: Secondary | ICD-10-CM | POA: Diagnosis not present

## 2020-12-21 DIAGNOSIS — M25551 Pain in right hip: Secondary | ICD-10-CM | POA: Diagnosis not present

## 2020-12-23 DIAGNOSIS — R2689 Other abnormalities of gait and mobility: Secondary | ICD-10-CM | POA: Diagnosis not present

## 2020-12-23 DIAGNOSIS — M25551 Pain in right hip: Secondary | ICD-10-CM | POA: Diagnosis not present

## 2020-12-23 DIAGNOSIS — Z23 Encounter for immunization: Secondary | ICD-10-CM | POA: Diagnosis not present

## 2021-01-26 DIAGNOSIS — D692 Other nonthrombocytopenic purpura: Secondary | ICD-10-CM | POA: Diagnosis not present

## 2021-01-26 DIAGNOSIS — R252 Cramp and spasm: Secondary | ICD-10-CM | POA: Diagnosis not present

## 2021-01-26 DIAGNOSIS — R233 Spontaneous ecchymoses: Secondary | ICD-10-CM | POA: Diagnosis not present

## 2021-02-23 DIAGNOSIS — R252 Cramp and spasm: Secondary | ICD-10-CM | POA: Diagnosis not present

## 2021-02-23 DIAGNOSIS — I2584 Coronary atherosclerosis due to calcified coronary lesion: Secondary | ICD-10-CM | POA: Diagnosis not present

## 2021-02-23 DIAGNOSIS — G4733 Obstructive sleep apnea (adult) (pediatric): Secondary | ICD-10-CM | POA: Diagnosis not present

## 2021-03-02 DIAGNOSIS — G4719 Other hypersomnia: Secondary | ICD-10-CM | POA: Diagnosis not present

## 2021-03-03 DIAGNOSIS — G4719 Other hypersomnia: Secondary | ICD-10-CM | POA: Diagnosis not present

## 2021-03-07 DIAGNOSIS — G4733 Obstructive sleep apnea (adult) (pediatric): Secondary | ICD-10-CM | POA: Diagnosis not present

## 2021-05-26 DIAGNOSIS — R197 Diarrhea, unspecified: Secondary | ICD-10-CM | POA: Diagnosis not present

## 2021-05-26 DIAGNOSIS — Z8719 Personal history of other diseases of the digestive system: Secondary | ICD-10-CM | POA: Diagnosis not present

## 2021-05-30 DIAGNOSIS — G4733 Obstructive sleep apnea (adult) (pediatric): Secondary | ICD-10-CM | POA: Diagnosis not present

## 2021-07-03 DIAGNOSIS — G4733 Obstructive sleep apnea (adult) (pediatric): Secondary | ICD-10-CM | POA: Diagnosis not present

## 2021-07-24 DIAGNOSIS — M79674 Pain in right toe(s): Secondary | ICD-10-CM | POA: Diagnosis not present

## 2021-08-01 DIAGNOSIS — G5761 Lesion of plantar nerve, right lower limb: Secondary | ICD-10-CM | POA: Diagnosis not present

## 2021-08-01 DIAGNOSIS — M205X1 Other deformities of toe(s) (acquired), right foot: Secondary | ICD-10-CM | POA: Diagnosis not present

## 2021-08-01 DIAGNOSIS — M2041 Other hammer toe(s) (acquired), right foot: Secondary | ICD-10-CM | POA: Diagnosis not present

## 2021-08-16 ENCOUNTER — Other Ambulatory Visit: Payer: Self-pay | Admitting: Internal Medicine

## 2021-08-16 DIAGNOSIS — Z1231 Encounter for screening mammogram for malignant neoplasm of breast: Secondary | ICD-10-CM

## 2021-08-17 DIAGNOSIS — M205X1 Other deformities of toe(s) (acquired), right foot: Secondary | ICD-10-CM | POA: Diagnosis not present

## 2021-08-17 DIAGNOSIS — G5761 Lesion of plantar nerve, right lower limb: Secondary | ICD-10-CM | POA: Diagnosis not present

## 2021-08-21 DIAGNOSIS — G4733 Obstructive sleep apnea (adult) (pediatric): Secondary | ICD-10-CM | POA: Diagnosis not present

## 2021-08-22 DIAGNOSIS — L821 Other seborrheic keratosis: Secondary | ICD-10-CM | POA: Diagnosis not present

## 2021-08-22 DIAGNOSIS — L570 Actinic keratosis: Secondary | ICD-10-CM | POA: Diagnosis not present

## 2021-08-22 DIAGNOSIS — D2272 Melanocytic nevi of left lower limb, including hip: Secondary | ICD-10-CM | POA: Diagnosis not present

## 2021-08-22 DIAGNOSIS — D2271 Melanocytic nevi of right lower limb, including hip: Secondary | ICD-10-CM | POA: Diagnosis not present

## 2021-08-23 ENCOUNTER — Ambulatory Visit
Admission: RE | Admit: 2021-08-23 | Discharge: 2021-08-23 | Disposition: A | Discharge status: Home / Self Care | Payer: Medicare Other | Source: Ambulatory Visit | Attending: Internal Medicine | Admitting: Internal Medicine

## 2021-08-23 DIAGNOSIS — Z1231 Encounter for screening mammogram for malignant neoplasm of breast: Secondary | ICD-10-CM

## 2021-08-28 DIAGNOSIS — G4733 Obstructive sleep apnea (adult) (pediatric): Secondary | ICD-10-CM | POA: Diagnosis not present

## 2021-09-13 DIAGNOSIS — M205X1 Other deformities of toe(s) (acquired), right foot: Secondary | ICD-10-CM | POA: Diagnosis not present

## 2021-09-13 DIAGNOSIS — G5761 Lesion of plantar nerve, right lower limb: Secondary | ICD-10-CM | POA: Diagnosis not present

## 2021-09-28 DIAGNOSIS — G4733 Obstructive sleep apnea (adult) (pediatric): Secondary | ICD-10-CM | POA: Diagnosis not present

## 2021-10-26 DIAGNOSIS — I2584 Coronary atherosclerosis due to calcified coronary lesion: Secondary | ICD-10-CM | POA: Diagnosis not present

## 2021-10-26 DIAGNOSIS — E7801 Familial hypercholesterolemia: Secondary | ICD-10-CM | POA: Diagnosis not present

## 2021-10-26 DIAGNOSIS — I2510 Atherosclerotic heart disease of native coronary artery without angina pectoris: Secondary | ICD-10-CM | POA: Diagnosis not present

## 2021-11-23 DIAGNOSIS — I2510 Atherosclerotic heart disease of native coronary artery without angina pectoris: Secondary | ICD-10-CM | POA: Diagnosis not present

## 2021-11-23 DIAGNOSIS — G2581 Restless legs syndrome: Secondary | ICD-10-CM | POA: Diagnosis not present

## 2021-11-23 DIAGNOSIS — M8580 Other specified disorders of bone density and structure, unspecified site: Secondary | ICD-10-CM | POA: Diagnosis not present

## 2021-11-23 DIAGNOSIS — Z0000 Encounter for general adult medical examination without abnormal findings: Secondary | ICD-10-CM | POA: Diagnosis not present

## 2021-11-28 ENCOUNTER — Ambulatory Visit: Payer: Medicare Other | Admitting: Nurse Practitioner

## 2021-12-07 ENCOUNTER — Ambulatory Visit: Payer: Medicare Other | Admitting: Sports Medicine

## 2021-12-07 ENCOUNTER — Encounter: Payer: Self-pay | Admitting: Family Medicine

## 2021-12-07 VITALS — BP 138/72 | HR 68 | Ht 65.0 in | Wt 161.2 lb

## 2021-12-07 DIAGNOSIS — M545 Low back pain, unspecified: Secondary | ICD-10-CM | POA: Diagnosis not present

## 2021-12-07 DIAGNOSIS — M9905 Segmental and somatic dysfunction of pelvic region: Secondary | ICD-10-CM

## 2021-12-07 DIAGNOSIS — M9904 Segmental and somatic dysfunction of sacral region: Secondary | ICD-10-CM | POA: Diagnosis not present

## 2021-12-07 DIAGNOSIS — M533 Sacrococcygeal disorders, not elsewhere classified: Secondary | ICD-10-CM | POA: Diagnosis not present

## 2021-12-07 DIAGNOSIS — M9903 Segmental and somatic dysfunction of lumbar region: Secondary | ICD-10-CM

## 2021-12-07 MED ORDER — PREDNISONE 50 MG PO TABS: 50.0000 mg | ORAL_TABLET | Freq: Every day | ORAL | 0 refills | Status: DC

## 2021-12-07 NOTE — Progress Notes (Deleted)
    Subjective:    CC: Low back pain  I, Molly Weber, LAT, ATC, am serving as scribe for Dr. [name].  HPI: Pt is a 71 y/o female c/o LBP.Marland Kitchen Pt was previously seen by Dr. [name] in 2021 for bilat knee pain. Today, pt c/o LBP x 7-10 weeks w/ no known MOI. Pt locates pain to the L side of her sacrum.  She leaves on Tuesday for one month to [place].  Radiating pain: yes into her buttock but not into her lge LE numbness/tingling: no Aggravates: L LE standing/weight-bearing Treatments tried: Naproxen; heat  Diagnostic testing: L-spine XR at [place] office 11/2021  Pertinent review of Systems: ***  Relevant historical information: ***   Objective:    Vitals:   12/07/21 1230  BP: 138/72  Pulse: 68  SpO2: 98%   General: Well Developed, well nourished, and in no acute distress.   MSK: ***  Lab and Radiology Results No results found for this or any previous visit (from the past 72 hour(s)). No results found.    Impression and Recommendations:    Assessment and Plan: 71 y.o. female with ***.  PDMP not reviewed this encounter. No orders of the defined types were placed in this encounter.  Meds ordered this encounter  Medications   predniSONE (DELTASONE) 50 MG tablet    Sig: Take 1 tablet (50 mg total) by mouth daily.    Dispense:  5 tablet    Refill:  0    Discussed warning signs or symptoms. Please see discharge instructions. Patient expresses understanding.   ***

## 2021-12-07 NOTE — Progress Notes (Signed)
Elaine Henderson D.[place] Phone: [phone]   Assessment and Plan:     1. Sacroiliac joint dysfunction of left side 2. Sacroiliac joint dysfunction of right side 3. Somatic dysfunction of lumbar region 4. Somatic dysfunction of pelvic region 5. Somatic dysfunction of sacral region -Acute, uncomplicated, initial sports medicine visit - 10 days of bilateral SI joint pain, more severe on left, without significant mechanism of injury - Patient brought in CD with recent x-ray imaging of pelvis and sacrum with cortical changes at L4-L5, without significant finding SI joints - Patient is planning on traveling in 1 week and will be hiking [place] - Start prednisone 50 mg daily. - Patient elected for initial OMT today.  Tolerated well per note below. - Decision today to treat with OMT was based on Physical Exam   After verbal consent patient was treated with HVLA (high velocity low amplitude), ME (muscle energy), FPR (flex positional release), ST (soft tissue), PC/PD (Pelvic Compression/ Pelvic Decompression) techniques in sacrum,  lumbar, and pelvic areas. Patient tolerated the procedure well with improvement in symptoms.  Patient educated on potential side effects of soreness and recommended to rest, hydrate, and use Tylenol as needed for pain control.   Pertinent previous records reviewed include none   Follow Up: Next Monday with Dr. Georgina Henderson to review benefit of prednisone and OMT.  Could consider SI joint injection if no improvement or worsening of symptoms   Subjective:   I, Elaine Henderson, am serving as a Education administrator for Doctor Elaine Henderson  Chief Complaint: OMT from Dr. Georgina Henderson   HPI:   12/07/21 CC: Low back pain   I, Elaine Henderson, LAT, ATC, am serving as scribe for Dr. Lynne Henderson.   HPI: Pt is a 71 y/o female c/o LBP.Marland Kitchen Pt was previously seen by Dr. Georgina Henderson in 2021 for bilat knee pain. Today, pt c/o LBP x 7-10 weeks  w/ no known MOI. Pt locates pain to the L side of her sacrum.  She leaves on Tuesday for one month to [place].   Radiating pain: yes into her buttock but not into her lge LE numbness/tingling: no Aggravates: L LE standing/weight-bearing Treatments tried: Naproxen; heat   Diagnostic testing: L-spine XR at [place] office 11/2021  Relevant Historical Information: None pertinent  Additional pertinent review of systems negative.  Current Outpatient Medications  Medication Sig Dispense Refill   Calcium Carbonate-Vitamin D (CALTRATE 600+D) 600-400 MG-UNIT per tablet Take 1 tablet by mouth daily.     Multiple Vitamin (MULTIVITAMIN) capsule Take 1 capsule by mouth daily.     naproxen (NAPROSYN) 250 MG tablet Take by mouth 2 (two) times daily with a meal.     predniSONE (DELTASONE) 50 MG tablet Take 1 tablet (50 mg total) by mouth daily. 5 tablet 0   rosuvastatin (CRESTOR) 10 MG tablet Take 10 mg by mouth daily.     No current facility-administered medications for this visit.      Objective:     Vitals:   12/07/21 1230  BP: 138/72  Pulse: 68  SpO2: 98%  Weight: 161 lb 3.2 oz (73.1 kg)  Height: '5\' 5"'$  (1.651 m)      Body mass index is 26.83 kg/m.    Physical Exam:     General: Well-appearing, cooperative, sitting comfortably in no acute distress.   OMT Physical Exam:  ASIS Compression Test: Positive left Sacrum: Positive sphinx, TTP bilateral sacral base Lumbar:  TTP paraspinal, L1-3 RLSR Pelvis: Left anterior innominate  Electronically signed by:  Elaine Henderson D.[facility] 1:21 PM 12/07/21

## 2021-12-07 NOTE — Patient Instructions (Signed)
Good to see you   

## 2021-12-08 NOTE — Progress Notes (Signed)
   I, Wendy Poet, LAT, ATC, am serving as scribe for Dr. [name].  Elaine Henderson is a 71 y.o. female who presents to [place] at [facility] today for f/u of L-sided sacral / SIJ pain.  She was last seen by Dr. Glennon Mac on 12/07/21 and had OMT.  She was also prescribed prednisone. Today, pt reports LBP is feeling worse. Pt has taken 3/5 of the prescribed prednisone. Pt notes one 1 day of relief from OMT and then pain began to worsen.  She is leaving tomorrow for a trip to the [facility] to see 6 [place].  Diagnostic testing: L-spine XR at [place] office- July 2023  Pertinent review of systems: No fevers or chills  Relevant historical information: Hyperlipidemia   Exam:  BP (!) 172/88   Pulse 68   Ht '5\' 5"'$  (1.651 m)   Wt 163 lb (73.9 kg)   SpO2 99%   BMI 27.12 kg/m  General: Well Developed, well nourished, and in no acute distress.   MSK: L-spine: Nontender midline.  Tender palpation left SI joint. Decreased lumbar motion.    Lab and Radiology Results  Procedure: Real-time Ultrasound Guided Injection of left SI joint Device: Philips Affiniti 50G Images permanently stored and available for review in PACS Verbal informed consent obtained.  Discussed risks and benefits of procedure. Warned about infection, bleeding, hyperglycemia damage to structures among others. Patient expresses understanding and agreement Time-out conducted.   Noted no overlying erythema, induration, or other signs of local infection.   Skin prepped in a sterile fashion.   Local anesthesia: Topical Ethyl chloride.   With sterile technique and under real time ultrasound guidance: 40 mg of Depo-Medrol and 2 mL of Marcaine injected into left SI joint. Fluid seen entering the joint capsule.   Completed without difficulty   Pain moderately  resolved suggesting accurate placement of the medication.   Advised to call if fevers/chills, erythema, induration, drainage, or  persistent bleeding.   Images permanently stored and available for review in the ultrasound unit.  Impression: Technically successful ultrasound guided injection.        Assessment and Plan: 71 y.o. female with acute left low back pain thought to be partially due to SI joint dysfunction.  Muscle spasm and dysfunction is also certainly a possibility.  She would benefit from physical therapy but is leaving tomorrow for a long trip.  Plan for steroid injection.  Tramadol prescribed as an emergency backup plan.  Discussed Tylenol and ibuprofen.  Recheck back as needed.   PDMP reviewed during this encounter. Orders Placed This Encounter  Procedures   [place] LIMITED JOINT SPACE STRUCTURES LOW LEFT(NO LINKED CHARGES)    Order Specific Question:   Reason for Exam (SYMPTOM  OR DIAGNOSIS REQUIRED)    Answer:   left SIJ pain    Order Specific Question:   Preferred imaging location?    Answer:   [place]   Meds ordered this encounter  Medications   traMADol (ULTRAM) 50 MG tablet    Sig: Take 1 tablet (50 mg total) by mouth every 8 (eight) hours as needed for severe pain.    Dispense:  15 tablet    Refill:  0   methylPREDNISolone acetate (DEPO-MEDROL) injection 40 mg     Discussed warning signs or symptoms. Please see discharge instructions. Patient expresses understanding.   The above documentation has been reviewed and is accurate and complete [name], M.D.

## 2021-12-11 ENCOUNTER — Ambulatory Visit (INDEPENDENT_AMBULATORY_CARE_PROVIDER_SITE_OTHER): Payer: Medicare Other | Admitting: Family Medicine

## 2021-12-11 ENCOUNTER — Ambulatory Visit: Payer: Self-pay

## 2021-12-11 VITALS — BP 172/88 | HR 68 | Ht 65.0 in | Wt 163.0 lb

## 2021-12-11 DIAGNOSIS — M533 Sacrococcygeal disorders, not elsewhere classified: Secondary | ICD-10-CM

## 2021-12-11 MED ORDER — METHYLPREDNISOLONE ACETATE 40 MG/ML IJ SUSP
40.0000 mg | Freq: Once | INTRAMUSCULAR | Status: AC
  Administered 2021-12-11: 40 mg via INTRA_ARTICULAR

## 2021-12-11 MED ORDER — TRAMADOL HCL 50 MG PO TABS: 50.0000 mg | ORAL_TABLET | Freq: Three times a day (TID) | ORAL | 0 refills | Status: DC | PRN

## 2021-12-11 NOTE — Patient Instructions (Addendum)
Thank you for coming in today.   You received an injection today. Seek immediate medical attention if the joint becomes red, extremely painful, or is oozing fluid.   Use the tramadol if you really need to.   Tylenol arthritis and ibuprofen is ok.

## 2022-01-23 ENCOUNTER — Ambulatory Visit: Payer: Medicare Other | Attending: Nurse Practitioner | Admitting: Cardiovascular Disease

## 2022-01-23 ENCOUNTER — Encounter: Payer: Self-pay | Admitting: Cardiovascular Disease

## 2022-01-23 DIAGNOSIS — E782 Mixed hyperlipidemia: Secondary | ICD-10-CM | POA: Diagnosis not present

## 2022-01-23 DIAGNOSIS — I447 Left bundle-branch block, unspecified: Secondary | ICD-10-CM | POA: Diagnosis not present

## 2022-01-23 DIAGNOSIS — R931 Abnormal findings on diagnostic imaging of heart and coronary circulation: Secondary | ICD-10-CM | POA: Diagnosis not present

## 2022-01-23 NOTE — Assessment & Plan Note (Addendum)
Coronary calcium score performed 08/23/20 was 99, principally in the LAD territory.  She is fairly active, hikes and denies chest pain or shortness of breath.

## 2022-01-23 NOTE — Progress Notes (Signed)
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  She was referred by Dr. Shelia Media for cardiovascular evaluation because of dizziness when she hikes in the heat.  She is accompanied by her husband Richardson Landry today.  I last saw her in the office 02/26/2020.  Her only risk factor is treated hyperlipidemia.  Both of her parents did have myocardial infarction's.  She is never had a heart attack or stroke.  She denies chest pain or shortness of breath.  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  Since being retired she and her husband have been hiking frequently she notices that when she is in the heat and walking she gets presyncopal.  It sounds like she may be dehydrated.  Since I saw her 2 years ago she has developed a new left bundle branch block.  She really has minimal dizziness and is fairly active and hikes with her husband Richardson Landry.  She did have a coronary calcium score performed 08/27/2020  which was 99 principally in the LAD territory.     Current Meds  Medication Sig   aspirin EC 81 MG tablet 81 mg daily in the afternoon.   Calcium Carbonate-Vitamin D (CALTRATE 600+D) 600-400 MG-UNIT per tablet Take 1 tablet by mouth daily.   Multiple Vitamin (MULTIVITAMIN) capsule Take 1 capsule by mouth daily.   rosuvastatin (CRESTOR) 20 MG tablet Take 20 mg by mouth every other day.     Allergies  Allergen Reactions   Nitrofurantoin Macrocrystal     Other reaction(s): Vomiting   Nitrofurantoin Monohyd Macro Nausea Only    Social History   Socioeconomic History   Marital status: Married    Spouse name: Not on file   Number of  children: Not on file   Years of education: Not on file   Highest education level: Not on file  Occupational History   Not on file  Tobacco Use   Smoking status: Never   Smokeless tobacco: Never   Tobacco comments:    never used tobacco  Substance and Sexual Activity   Alcohol use: Not on file   Drug use: Not on file   Sexual activity: Not on file  Other Topics Concern   Not on file  Social History Narrative   Not on file   Social Determinants of Health   Financial Resource Strain: Not on file  Food Insecurity: Not on file  Transportation Needs: Not on file  Physical Activity: Not on file  Stress: Not on file  Social Connections: Not on file  Intimate Partner Violence: Not on file     Review of Systems: General: negative for chills, fever, night sweats or weight changes.  Cardiovascular: negative for chest pain, dyspnea on exertion, edema, orthopnea, palpitations, paroxysmal nocturnal dyspnea or shortness of breath Dermatological: negative for rash Respiratory: negative for cough or wheezing Urologic: negative for hematuria Abdominal: negative for nausea, vomiting, diarrhea, bright red blood per rectum, melena, or hematemesis Neurologic: negative for visual changes, syncope, or dizziness All other systems reviewed and are otherwise negative except as noted above.    Blood pressure (!) 154/72, pulse 72, height '5\' 5"'$  (1.651 m), weight  155 lb 12.8 oz (70.7 kg), SpO2 95 %.  General appearance: alert and no distress Neck: no adenopathy, no carotid bruit, no JVD, supple, symmetrical, trachea midline, and thyroid not enlarged, symmetric, no tenderness/mass/nodules Lungs: clear to auscultation bilaterally Heart: regular rate and rhythm, S1, S2 normal, no murmur, click, rub or gallop Extremities: extremities normal, atraumatic, no cyanosis or edema Pulses: 2+ and symmetric Skin: Skin color, texture, turgor normal. No rashes or lesions Neurologic: Grossly normal  EKG sinus  rhythm at 72 with left bundle branch block new since previous tracing.  I personally reviewed this EKG.  ASSESSMENT AND PLAN:   Hyperlipidemia History of hyperlipidemia on statin statin therapy every other day with lipid profile performed 10/26/2021 revealing total cholesterol 153 with an HDL of 56.  Left bundle branch block New since I saw her 2 years ago.  Elevated coronary artery calcium score Coronary calcium score performed 08/23/20 was 99, principally in the LAD territory.  She is fairly active, hikes and denies chest pain or shortness of breath.     Lorretta Harp MD FACP,FACC,FAHA, [facility] 01/23/2022 3:07 PM

## 2022-01-23 NOTE — Patient Instructions (Signed)
Medication Instructions:  Your physician recommends that you continue on your current medications as directed. Please refer to the Current Medication list given to you today.  *If you need a refill on your cardiac medications before your next appointment, please call your pharmacy*   Testing/Procedures: Your physician has requested that you have an echocardiogram. Echocardiography is a painless test that uses sound waves to create images of your heart. It provides your doctor with information about the size and shape of your heart and how well your heart's chambers and valves are working. This procedure takes approximately one hour. There are no restrictions for this procedure. This procedure will be done at [address]   Follow-Up: At [facility], you and your health needs are our priority.  As part of our continuing mission to provide you with exceptional heart care, we have created designated Provider Care Teams.  These Care Teams include your primary Cardiologist (physician) and Advanced Practice Providers (APPs -  Physician Assistants and Nurse Practitioners) who all work together to provide you with the care you need, when you need it.  We recommend signing up for the patient portal called "MyChart".  Sign up information is provided on this After Visit Summary.  MyChart is used to connect with patients for Virtual Visits (Telemedicine).  Patients are able to view lab/test results, encounter notes, upcoming appointments, etc.  Non-urgent messages can be sent to your provider as well.   To learn more about what you can do with MyChart, go to [email or web address].    Your next appointment:   12 month(s)  The format for your next appointment:   In Person  Provider:   Quay Burow, MD

## 2022-01-23 NOTE — Assessment & Plan Note (Signed)
New since I saw her 2 years ago.

## 2022-01-23 NOTE — Assessment & Plan Note (Addendum)
History of hyperlipidemia on statin statin therapy every other day with lipid profile performed 10/26/2021 revealing total cholesterol 153 with an HDL of 56, and an LDL of 80.

## 2022-02-08 ENCOUNTER — Ambulatory Visit (HOSPITAL_COMMUNITY): Payer: Medicare Other | Attending: Cardiovascular Disease

## 2022-02-08 DIAGNOSIS — R931 Abnormal findings on diagnostic imaging of heart and coronary circulation: Secondary | ICD-10-CM | POA: Diagnosis not present

## 2022-02-08 DIAGNOSIS — E782 Mixed hyperlipidemia: Secondary | ICD-10-CM | POA: Diagnosis not present

## 2022-02-08 DIAGNOSIS — I447 Left bundle-branch block, unspecified: Secondary | ICD-10-CM | POA: Diagnosis not present

## 2022-02-08 LAB — ECHOCARDIOGRAM COMPLETE
Area-P 1/2: 3.19 cm2
S' Lateral: 2.5 cm

## 2022-02-12 DIAGNOSIS — G4733 Obstructive sleep apnea (adult) (pediatric): Secondary | ICD-10-CM | POA: Diagnosis not present

## 2022-03-21 DIAGNOSIS — H524 Presbyopia: Secondary | ICD-10-CM | POA: Diagnosis not present

## 2022-05-28 DIAGNOSIS — L309 Dermatitis, unspecified: Secondary | ICD-10-CM | POA: Diagnosis not present

## 2022-06-08 DIAGNOSIS — R030 Elevated blood-pressure reading, without diagnosis of hypertension: Secondary | ICD-10-CM | POA: Diagnosis not present

## 2022-06-08 DIAGNOSIS — J069 Acute upper respiratory infection, unspecified: Secondary | ICD-10-CM | POA: Diagnosis not present

## 2022-10-01 ENCOUNTER — Other Ambulatory Visit: Payer: Self-pay | Admitting: Internal Medicine

## 2022-10-01 DIAGNOSIS — Z1231 Encounter for screening mammogram for malignant neoplasm of breast: Secondary | ICD-10-CM

## 2022-10-04 ENCOUNTER — Ambulatory Visit
Admission: RE | Admit: 2022-10-04 | Discharge: 2022-10-04 | Disposition: A | Discharge status: Home / Self Care | Payer: Medicare Other | Source: Ambulatory Visit | Attending: Internal Medicine | Admitting: Internal Medicine

## 2022-10-04 DIAGNOSIS — Z1231 Encounter for screening mammogram for malignant neoplasm of breast: Secondary | ICD-10-CM | POA: Diagnosis not present

## 2022-11-26 DIAGNOSIS — E7800 Pure hypercholesterolemia, unspecified: Secondary | ICD-10-CM | POA: Diagnosis not present

## 2022-11-29 DIAGNOSIS — Z0000 Encounter for general adult medical examination without abnormal findings: Secondary | ICD-10-CM | POA: Diagnosis not present

## 2022-11-29 DIAGNOSIS — G4733 Obstructive sleep apnea (adult) (pediatric): Secondary | ICD-10-CM | POA: Diagnosis not present

## 2022-11-29 DIAGNOSIS — I2510 Atherosclerotic heart disease of native coronary artery without angina pectoris: Secondary | ICD-10-CM | POA: Diagnosis not present

## 2022-11-29 DIAGNOSIS — M8580 Other specified disorders of bone density and structure, unspecified site: Secondary | ICD-10-CM | POA: Diagnosis not present

## 2022-11-29 DIAGNOSIS — E7800 Pure hypercholesterolemia, unspecified: Secondary | ICD-10-CM | POA: Diagnosis not present

## 2023-02-06 DIAGNOSIS — Z23 Encounter for immunization: Secondary | ICD-10-CM | POA: Diagnosis not present

## 2023-02-06 DIAGNOSIS — R0781 Pleurodynia: Secondary | ICD-10-CM | POA: Diagnosis not present

## 2023-03-21 DIAGNOSIS — D225 Melanocytic nevi of trunk: Secondary | ICD-10-CM | POA: Diagnosis not present

## 2023-03-21 DIAGNOSIS — D224 Melanocytic nevi of scalp and neck: Secondary | ICD-10-CM | POA: Diagnosis not present

## 2023-03-21 DIAGNOSIS — D692 Other nonthrombocytopenic purpura: Secondary | ICD-10-CM | POA: Diagnosis not present

## 2023-03-21 DIAGNOSIS — L821 Other seborrheic keratosis: Secondary | ICD-10-CM | POA: Diagnosis not present

## 2023-03-28 DIAGNOSIS — G4733 Obstructive sleep apnea (adult) (pediatric): Secondary | ICD-10-CM | POA: Diagnosis not present

## 2023-04-08 DIAGNOSIS — R197 Diarrhea, unspecified: Secondary | ICD-10-CM | POA: Diagnosis not present

## 2023-04-09 DIAGNOSIS — R197 Diarrhea, unspecified: Secondary | ICD-10-CM | POA: Diagnosis not present

## 2023-05-09 DIAGNOSIS — K080 Exfoliation of teeth due to systemic causes: Secondary | ICD-10-CM | POA: Diagnosis not present

## 2023-06-04 DIAGNOSIS — M810 Age-related osteoporosis without current pathological fracture: Secondary | ICD-10-CM | POA: Diagnosis not present

## 2023-06-05 DIAGNOSIS — H5203 Hypermetropia, bilateral: Secondary | ICD-10-CM | POA: Diagnosis not present

## 2023-06-05 DIAGNOSIS — M810 Age-related osteoporosis without current pathological fracture: Secondary | ICD-10-CM | POA: Diagnosis not present

## 2023-07-03 ENCOUNTER — Other Ambulatory Visit: Payer: Self-pay | Admitting: Medical Genetics

## 2023-07-09 ENCOUNTER — Other Ambulatory Visit (HOSPITAL_COMMUNITY): Payer: Self-pay

## 2023-07-15 ENCOUNTER — Other Ambulatory Visit: Payer: Medicare Other

## 2023-07-15 DIAGNOSIS — Z006 Encounter for examination for normal comparison and control in clinical research program: Secondary | ICD-10-CM

## 2023-07-22 DIAGNOSIS — R300 Dysuria: Secondary | ICD-10-CM | POA: Diagnosis not present

## 2023-07-24 LAB — GENECONNECT MOLECULAR SCREEN: Genetic Analysis Overall Interpretation: NEGATIVE

## 2023-09-30 DIAGNOSIS — H1131 Conjunctival hemorrhage, right eye: Secondary | ICD-10-CM | POA: Diagnosis not present

## 2023-11-08 ENCOUNTER — Other Ambulatory Visit: Payer: Self-pay | Admitting: Internal Medicine

## 2023-11-08 DIAGNOSIS — Z1231 Encounter for screening mammogram for malignant neoplasm of breast: Secondary | ICD-10-CM

## 2023-11-18 ENCOUNTER — Ambulatory Visit

## 2023-11-19 ENCOUNTER — Ambulatory Visit
Admission: RE | Admit: 2023-11-19 | Discharge: 2023-11-19 | Disposition: A | Discharge status: Home / Self Care | Source: Ambulatory Visit | Attending: Internal Medicine | Admitting: Internal Medicine

## 2023-11-19 DIAGNOSIS — Z1231 Encounter for screening mammogram for malignant neoplasm of breast: Secondary | ICD-10-CM | POA: Diagnosis not present

## 2023-11-27 DIAGNOSIS — K080 Exfoliation of teeth due to systemic causes: Secondary | ICD-10-CM | POA: Diagnosis not present

## 2023-12-02 DIAGNOSIS — N390 Urinary tract infection, site not specified: Secondary | ICD-10-CM | POA: Diagnosis not present

## 2023-12-02 DIAGNOSIS — E7800 Pure hypercholesterolemia, unspecified: Secondary | ICD-10-CM | POA: Diagnosis not present

## 2023-12-02 DIAGNOSIS — I959 Hypotension, unspecified: Secondary | ICD-10-CM | POA: Diagnosis not present

## 2023-12-03 ENCOUNTER — Ambulatory Visit: Attending: Cardiovascular Disease | Admitting: Cardiovascular Disease

## 2023-12-03 ENCOUNTER — Encounter: Payer: Self-pay | Admitting: Cardiovascular Disease

## 2023-12-03 VITALS — BP 128/68 | HR 63 | Ht 65.5 in | Wt 160.0 lb

## 2023-12-03 DIAGNOSIS — G4733 Obstructive sleep apnea (adult) (pediatric): Secondary | ICD-10-CM | POA: Insufficient documentation

## 2023-12-03 DIAGNOSIS — E782 Mixed hyperlipidemia: Secondary | ICD-10-CM

## 2023-12-03 DIAGNOSIS — I447 Left bundle-branch block, unspecified: Secondary | ICD-10-CM

## 2023-12-03 DIAGNOSIS — R931 Abnormal findings on diagnostic imaging of heart and coronary circulation: Secondary | ICD-10-CM | POA: Diagnosis not present

## 2023-12-03 NOTE — Progress Notes (Signed)
 12/03/2023 Elaine Henderson   [birth year]  [id number]  Primary Physician Clarice Nottingham, MD Primary Cardiologist: Dorn JINNY Lesches MD GENI CODY MADEIRA, FSCAI  HPI:  Elaine Henderson is a 73 y.o.  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  She was referred by Dr. Clarice for cardiovascular evaluation because of dizziness when she hikes in the heat.  She is accompanied by her husband Marcey today.  I last saw her in the office 01/23/2022.  Her only risk factor is treated hyperlipidemia.  Both of her parents did have myocardial infarction's.  She is never had a heart attack or stroke.  She denies chest pain or shortness of breath.  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  Since being retired she and her husband have been hiking frequently she notices that when she is in the heat and walking she gets presyncopal.  It sounds like she may be dehydrated.   She did have a coronary calcium score performed 08/27/2020  which was 99 principally in the LAD territory.  A 2D echocardiogram performed 02/08/2022 was essentially normal with septal dyssynergy from left bundle branch block .  Since I saw her 2 years ago she has remained stable.  She has not been hiking as much as she has been in the past but has [place]  She denies chest pain or shortness of breath.  She has been diagnosed with sleep apnea and has been placed on CPAP since I saw her last.   Current Meds  Medication Sig   alendronate (FOSAMAX) 70 MG tablet Take 70 mg by mouth once a week.   aspirin EC 81 MG tablet 81 mg daily in the afternoon. (Patient taking differently: Take 81 mg by mouth every other day.)   Calcium Carbonate-Vitamin D  (CALTRATE 600+D) 600-400 MG-UNIT per tablet Take 1 tablet by mouth daily. (Patient taking  differently: Take 1 tablet by mouth every other day.)   Multiple Vitamin (MULTIVITAMIN) capsule Take 1 capsule by mouth daily. (Patient taking differently: Take 1 capsule by mouth every other day.)   naproxen (NAPROSYN) 250 MG tablet Take by mouth 2 (two) times daily with a meal. (Patient taking differently: Take by mouth as needed.)   rosuvastatin (CRESTOR) 20 MG tablet Take 20 mg by mouth every other day.     Allergies  Allergen Reactions   Nitrofurantoin Macrocrystal     Other reaction(s): Vomiting   Nitrofurantoin     Other Reaction(s): GI upset   Nitrofurantoin Monohyd Macro Nausea Only   Tetanus-Diphth-Acell Pertussis     Other Reaction(s): entire body ached   Tramadol  Hcl Nausea Only    Social History   Socioeconomic History   Marital status: Married    Spouse name: Not on file   Number of children: Not on file   Years of education: Not on file   Highest education level: Not on file  Occupational History   Not on file  Tobacco Use   Smoking status: Never   Smokeless tobacco: Never   Tobacco comments:    never used tobacco  Substance and Sexual Activity   Alcohol use: Not on file   Drug use: Not on file   Sexual activity: Not on file  Other Topics Concern   Not on file  Social History Narrative  Not on file   Social Drivers of Health   Financial Resource Strain: Not on file  Food Insecurity: Not on file  Transportation Needs: Not on file  Physical Activity: Not on file  Stress: Not on file  Social Connections: Not on file  Intimate Partner Violence: Not on file     Review of Systems: General: negative for chills, fever, night sweats or weight changes.  Cardiovascular: negative for chest pain, dyspnea on exertion, edema, orthopnea, palpitations, paroxysmal nocturnal dyspnea or shortness of breath Dermatological: negative for rash Respiratory: negative for cough or wheezing Urologic: negative for hematuria Abdominal: negative for nausea, vomiting,  diarrhea, bright red blood per rectum, melena, or hematemesis Neurologic: negative for visual changes, syncope, or dizziness All other systems reviewed and are otherwise negative except as noted above.    Blood pressure 128/68, pulse 63, height 5' 5.5 (1.664 m), weight 160 lb (72.6 kg), SpO2 99%.  General appearance: alert and no distress Neck: no adenopathy, no carotid bruit, no JVD, supple, symmetrical, trachea midline, and thyroid not enlarged, symmetric, no tenderness/mass/nodules Lungs: clear to auscultation bilaterally Heart: regular rate and rhythm, S1, S2 normal, no murmur, click, rub or gallop Extremities: extremities normal, atraumatic, no cyanosis or edema Pulses: 2+ and symmetric Skin: Skin color, texture, turgor normal. No rashes or lesions Neurologic: Grossly normal  EKG EKG Interpretation Date/Time:  Tuesday December 03 2023 10:13:28 EDT Ventricular Rate:  63 PR Interval:  176 QRS Duration:  130 QT Interval:  446 QTC Calculation: 456 R Axis:   14  Text Interpretation: Normal sinus rhythm Left bundle branch block No previous ECGs available Confirmed by Court Carrier [phone]) on 12/03/2023 10:24:00 AM    ASSESSMENT AND PLAN:   Hyperlipidemia History of hyperlipidemia on rosuvastatin lipid profile performed 11/26/2022 revealing total cholesterol 145, LDL 76 and HDL 51.  Left bundle branch block Chronic  Elevated coronary artery calcium score Mildly elevated coronary calcium score of 99 principally the LAD territory performed 08/27/20.  She is otherwise asymptomatic.  Obstructive sleep apnea On CPAP     Carrier DOROTHA Court MD [facility], [facility] 12/03/2023 10:34 AM

## 2023-12-03 NOTE — Assessment & Plan Note (Signed)
 On CPAP. ?

## 2023-12-03 NOTE — Assessment & Plan Note (Signed)
 Mildly elevated coronary calcium score of 99 principally the LAD territory performed 08/27/20.  She is otherwise asymptomatic.

## 2023-12-03 NOTE — Assessment & Plan Note (Signed)
 History of hyperlipidemia on rosuvastatin lipid profile performed 11/26/2022 revealing total cholesterol 145, LDL 76 and HDL 51.

## 2023-12-03 NOTE — Patient Instructions (Signed)
 Medication Instructions:  Your physician recommends that you continue on your current medications as directed. Please refer to the Current Medication list given to you today.  *If you need a refill on your cardiac medications before your next appointment, please call your pharmacy*  Follow-Up: At [facility], you and your health needs are our priority.  As part of our continuing mission to provide you with exceptional heart care, our providers are all part of one team.  This team includes your primary Cardiologist (physician) and Advanced Practice Providers or APPs (Physician Assistants and Nurse Practitioners) who all work together to provide you with the care you need, when you need it.  Your next appointment:   12 month(s)  Provider:   Marcie Sever, PA-C, Callie Goodrich, PA-C, Hao Meng, PA-C, Marlana Silvan, NP, or Katlyn West, NP         Then, Lauro Portal, MD will plan to see you again in 2 year(s).     We recommend signing up for the patient portal called "MyChart".  Sign up information is provided on this After Visit Summary.  MyChart is used to connect with patients for Virtual Visits (Telemedicine).  Patients are able to view lab/test results, encounter notes, upcoming appointments, etc.  Non-urgent messages can be sent to your provider as well.   To learn more about what you can do with MyChart, go to [email or web address].

## 2023-12-03 NOTE — Assessment & Plan Note (Signed)
 Chronic

## 2023-12-05 DIAGNOSIS — Z0000 Encounter for general adult medical examination without abnormal findings: Secondary | ICD-10-CM | POA: Diagnosis not present

## 2023-12-05 DIAGNOSIS — G4733 Obstructive sleep apnea (adult) (pediatric): Secondary | ICD-10-CM | POA: Diagnosis not present

## 2023-12-05 DIAGNOSIS — I2584 Coronary atherosclerosis due to calcified coronary lesion: Secondary | ICD-10-CM | POA: Diagnosis not present

## 2023-12-05 DIAGNOSIS — M810 Age-related osteoporosis without current pathological fracture: Secondary | ICD-10-CM | POA: Diagnosis not present

## 2023-12-05 DIAGNOSIS — I447 Left bundle-branch block, unspecified: Secondary | ICD-10-CM | POA: Diagnosis not present

## 2023-12-09 DIAGNOSIS — K080 Exfoliation of teeth due to systemic causes: Secondary | ICD-10-CM | POA: Diagnosis not present

## 2024-01-16 DIAGNOSIS — E7800 Pure hypercholesterolemia, unspecified: Secondary | ICD-10-CM | POA: Diagnosis not present

## 2024-02-19 DIAGNOSIS — R300 Dysuria: Secondary | ICD-10-CM | POA: Diagnosis not present

## 2024-02-19 DIAGNOSIS — R3589 Other polyuria: Secondary | ICD-10-CM | POA: Diagnosis not present

## 2024-03-12 DIAGNOSIS — K5900 Constipation, unspecified: Secondary | ICD-10-CM | POA: Diagnosis not present

## 2024-03-12 DIAGNOSIS — Z860101 Personal history of adenomatous and serrated colon polyps: Secondary | ICD-10-CM | POA: Diagnosis not present

## 2024-03-23 DIAGNOSIS — L821 Other seborrheic keratosis: Secondary | ICD-10-CM | POA: Diagnosis not present

## 2024-03-23 DIAGNOSIS — L814 Other melanin hyperpigmentation: Secondary | ICD-10-CM | POA: Diagnosis not present

## 2024-03-23 DIAGNOSIS — D1801 Hemangioma of skin and subcutaneous tissue: Secondary | ICD-10-CM | POA: Diagnosis not present
